# Patient Record
Sex: Male | Born: 1970
Health system: Southern US, Community
[De-identification: ages and names within clinical notes are randomized; demographics above are authoritative.]

## PROBLEM LIST (undated history)

## (undated) DIAGNOSIS — G4730 Sleep apnea, unspecified: Secondary | ICD-10-CM

## (undated) DIAGNOSIS — R51 Headache: Secondary | ICD-10-CM

## (undated) DIAGNOSIS — E785 Hyperlipidemia, unspecified: Secondary | ICD-10-CM

## (undated) DIAGNOSIS — L0390 Cellulitis, unspecified: Secondary | ICD-10-CM

## (undated) DIAGNOSIS — F419 Anxiety disorder, unspecified: Secondary | ICD-10-CM

## (undated) HISTORY — DX: Anxiety disorder, unspecified: F41.9

## (undated) HISTORY — PX: OTHER SURGICAL HISTORY: SHX169

---

## 1998-05-04 ENCOUNTER — Encounter: Admission: RE | Admit: 1998-05-04 | Discharge: 1998-05-04 | Payer: Self-pay | Admitting: Family Medicine

## 1998-05-14 ENCOUNTER — Encounter: Admission: RE | Admit: 1998-05-14 | Discharge: 1998-05-14 | Payer: Self-pay | Admitting: Family Medicine

## 1999-09-23 ENCOUNTER — Encounter: Admission: RE | Admit: 1999-09-23 | Discharge: 1999-09-23 | Payer: Self-pay | Admitting: Family Medicine

## 2001-07-05 ENCOUNTER — Encounter: Admission: RE | Admit: 2001-07-05 | Discharge: 2001-07-05 | Payer: Self-pay | Admitting: Sports Medicine

## 2002-07-31 ENCOUNTER — Emergency Department (HOSPITAL_COMMUNITY): Admission: EM | Admit: 2002-07-31 | Discharge: 2002-07-31 | Payer: Self-pay | Admitting: Emergency Medicine

## 2002-08-05 ENCOUNTER — Encounter: Admission: RE | Admit: 2002-08-05 | Discharge: 2002-08-05 | Payer: Self-pay | Admitting: Family Medicine

## 2003-10-06 ENCOUNTER — Encounter: Admission: RE | Admit: 2003-10-06 | Discharge: 2003-10-06 | Payer: Self-pay | Admitting: Family Medicine

## 2005-05-19 ENCOUNTER — Ambulatory Visit: Payer: Self-pay | Admitting: Family Medicine

## 2006-10-12 ENCOUNTER — Ambulatory Visit: Payer: Self-pay | Admitting: Family Medicine

## 2006-10-12 LAB — CONVERTED CEMR LAB
ALT: 22 units/L (ref 0–53)
AST: 20 units/L (ref 0–37)
Albumin: 4.5 g/dL (ref 3.5–5.2)
Alkaline Phosphatase: 79 units/L (ref 39–117)
Calcium: 9.3 mg/dL (ref 8.4–10.5)
Chloride: 107 meq/L (ref 96–112)
HDL: 38 mg/dL — ABNORMAL LOW (ref >39)
LDL Cholesterol: 160 mg/dL — ABNORMAL HIGH (ref 0–99)
Potassium: 4.4 meq/L (ref 3.5–5.3)
Sodium: 140 meq/L (ref 135–145)
Total Protein: 7.8 g/dL (ref 6.0–8.3)

## 2006-10-22 DIAGNOSIS — F329 Major depressive disorder, single episode, unspecified: Secondary | ICD-10-CM | POA: Insufficient documentation

## 2006-10-22 DIAGNOSIS — E7800 Pure hypercholesterolemia, unspecified: Secondary | ICD-10-CM | POA: Insufficient documentation

## 2006-11-20 ENCOUNTER — Telehealth: Payer: Self-pay | Admitting: *Deleted

## 2007-01-15 ENCOUNTER — Ambulatory Visit: Payer: Self-pay | Admitting: Family Medicine

## 2007-01-19 ENCOUNTER — Telehealth: Payer: Self-pay | Admitting: Family Medicine

## 2007-02-12 ENCOUNTER — Ambulatory Visit: Payer: Self-pay | Admitting: Family Medicine

## 2007-03-05 ENCOUNTER — Telehealth: Payer: Self-pay | Admitting: Family Medicine

## 2007-03-11 ENCOUNTER — Telehealth: Payer: Self-pay | Admitting: Family Medicine

## 2007-03-15 ENCOUNTER — Telehealth: Payer: Self-pay | Admitting: *Deleted

## 2007-03-19 ENCOUNTER — Ambulatory Visit: Payer: Self-pay | Admitting: Family Medicine

## 2007-03-22 ENCOUNTER — Telehealth: Payer: Self-pay | Admitting: Family Medicine

## 2007-03-30 ENCOUNTER — Ambulatory Visit: Payer: Self-pay | Admitting: Family Medicine

## 2007-03-30 ENCOUNTER — Telehealth (INDEPENDENT_AMBULATORY_CARE_PROVIDER_SITE_OTHER): Payer: Self-pay | Admitting: *Deleted

## 2007-03-31 ENCOUNTER — Telehealth (INDEPENDENT_AMBULATORY_CARE_PROVIDER_SITE_OTHER): Payer: Self-pay | Admitting: *Deleted

## 2007-04-06 ENCOUNTER — Telehealth: Payer: Self-pay | Admitting: Family Medicine

## 2007-04-19 ENCOUNTER — Ambulatory Visit: Payer: Self-pay | Admitting: Family Medicine

## 2007-09-10 ENCOUNTER — Telehealth: Payer: Self-pay | Admitting: Family Medicine

## 2007-12-31 ENCOUNTER — Telehealth: Payer: Self-pay | Admitting: *Deleted

## 2008-05-23 ENCOUNTER — Encounter: Payer: Self-pay | Admitting: Family Medicine

## 2008-07-06 ENCOUNTER — Encounter: Payer: Self-pay | Admitting: Family Medicine

## 2008-07-26 ENCOUNTER — Telehealth: Payer: Self-pay | Admitting: Family Medicine

## 2008-08-04 ENCOUNTER — Ambulatory Visit: Payer: Self-pay | Admitting: Family Medicine

## 2008-08-04 DIAGNOSIS — L301 Dyshidrosis [pompholyx]: Secondary | ICD-10-CM | POA: Insufficient documentation

## 2008-08-04 LAB — CONVERTED CEMR LAB: Chlamydia, Swab/Urine, PCR: NEGATIVE

## 2008-08-10 ENCOUNTER — Encounter: Payer: Self-pay | Admitting: Family Medicine

## 2008-08-11 ENCOUNTER — Ambulatory Visit: Payer: Self-pay | Admitting: Family Medicine

## 2008-08-11 ENCOUNTER — Encounter: Payer: Self-pay | Admitting: Family Medicine

## 2008-08-14 ENCOUNTER — Encounter (INDEPENDENT_AMBULATORY_CARE_PROVIDER_SITE_OTHER): Payer: Self-pay | Admitting: *Deleted

## 2008-08-14 LAB — CONVERTED CEMR LAB
AST: 26 units/L (ref 0–37)
Albumin: 4.3 g/dL (ref 3.5–5.2)
Alkaline Phosphatase: 74 units/L (ref 39–117)
Glucose, Bld: 96 mg/dL (ref 70–99)
HDL: 33 mg/dL — ABNORMAL LOW (ref >39)
LDL Cholesterol: 153 mg/dL — ABNORMAL HIGH (ref 0–99)
Potassium: 4.1 meq/L (ref 3.5–5.3)
Sodium: 142 meq/L (ref 135–145)
Total Bilirubin: 0.7 mg/dL (ref 0.3–1.2)
Total Protein: 7.7 g/dL (ref 6.0–8.3)
Triglycerides: 130 mg/dL (ref <150)
VLDL: 26 mg/dL (ref 0–40)

## 2009-03-12 ENCOUNTER — Telehealth: Payer: Self-pay | Admitting: Family Medicine

## 2009-07-30 ENCOUNTER — Telehealth: Payer: Self-pay | Admitting: Family Medicine

## 2009-07-31 ENCOUNTER — Ambulatory Visit: Admission: RE | Admit: 2009-07-31 | Discharge: 2009-07-31 | Payer: Self-pay | Admitting: Family Medicine

## 2009-07-31 ENCOUNTER — Encounter: Payer: Self-pay | Admitting: Family Medicine

## 2009-07-31 ENCOUNTER — Ambulatory Visit: Payer: Self-pay | Admitting: Family Medicine

## 2009-07-31 ENCOUNTER — Ambulatory Visit: Payer: Self-pay | Admitting: Vascular Surgery

## 2009-07-31 DIAGNOSIS — R51 Headache: Secondary | ICD-10-CM | POA: Insufficient documentation

## 2009-07-31 DIAGNOSIS — R519 Headache, unspecified: Secondary | ICD-10-CM | POA: Insufficient documentation

## 2009-08-03 ENCOUNTER — Encounter: Payer: Self-pay | Admitting: Family Medicine

## 2009-08-03 DIAGNOSIS — L02419 Cutaneous abscess of limb, unspecified: Secondary | ICD-10-CM | POA: Insufficient documentation

## 2009-08-03 DIAGNOSIS — L03119 Cellulitis of unspecified part of limb: Secondary | ICD-10-CM

## 2009-08-06 ENCOUNTER — Encounter: Payer: Self-pay | Admitting: Family Medicine

## 2009-08-08 ENCOUNTER — Telehealth (INDEPENDENT_AMBULATORY_CARE_PROVIDER_SITE_OTHER): Payer: Self-pay | Admitting: Family Medicine

## 2009-08-08 ENCOUNTER — Encounter: Payer: Self-pay | Admitting: Family Medicine

## 2010-09-26 ENCOUNTER — Encounter: Payer: Self-pay | Admitting: *Deleted

## 2011-08-01 ENCOUNTER — Institutional Professional Consult (permissible substitution): Payer: Self-pay | Admitting: Internal Medicine

## 2012-10-08 ENCOUNTER — Encounter: Payer: Self-pay | Admitting: Family Medicine

## 2012-10-22 ENCOUNTER — Ambulatory Visit (INDEPENDENT_AMBULATORY_CARE_PROVIDER_SITE_OTHER): Payer: BC Managed Care – PPO | Admitting: Family Medicine

## 2012-10-22 ENCOUNTER — Encounter: Payer: Self-pay | Admitting: Family Medicine

## 2012-10-22 VITALS — BP 140/88 | HR 84 | Temp 98.1°F | Ht 72.0 in | Wt 309.0 lb

## 2012-10-22 DIAGNOSIS — E7800 Pure hypercholesterolemia, unspecified: Secondary | ICD-10-CM

## 2012-10-22 DIAGNOSIS — E669 Obesity, unspecified: Secondary | ICD-10-CM

## 2012-10-22 DIAGNOSIS — L309 Dermatitis, unspecified: Secondary | ICD-10-CM

## 2012-10-22 DIAGNOSIS — L259 Unspecified contact dermatitis, unspecified cause: Secondary | ICD-10-CM

## 2012-10-22 DIAGNOSIS — G4730 Sleep apnea, unspecified: Secondary | ICD-10-CM

## 2012-10-22 DIAGNOSIS — R030 Elevated blood-pressure reading, without diagnosis of hypertension: Secondary | ICD-10-CM

## 2012-10-22 DIAGNOSIS — G4733 Obstructive sleep apnea (adult) (pediatric): Secondary | ICD-10-CM | POA: Insufficient documentation

## 2012-10-22 DIAGNOSIS — G4710 Hypersomnia, unspecified: Secondary | ICD-10-CM

## 2012-10-22 LAB — LIPID PANEL
Cholesterol: 226 mg/dL — ABNORMAL HIGH (ref 0–200)
LDL Cholesterol: 157 mg/dL — ABNORMAL HIGH (ref 0–99)
VLDL: 29 mg/dL (ref 0–40)

## 2012-10-22 LAB — COMPLETE METABOLIC PANEL WITH GFR
ALT: 24 U/L (ref 0–53)
AST: 22 U/L (ref 0–37)
Creat: 0.99 mg/dL (ref 0.50–1.35)
Total Bilirubin: 0.5 mg/dL (ref 0.3–1.2)

## 2012-10-22 MED ORDER — TRIAMCINOLONE ACETONIDE 0.1 % EX OINT: TOPICAL_OINTMENT | Freq: Every day | CUTANEOUS | Status: DC

## 2012-10-22 NOTE — Assessment & Plan Note (Signed)
Refill med 

## 2012-10-22 NOTE — Assessment & Plan Note (Signed)
Will recheck to see if meds needed.  For now diet and exercise.

## 2012-10-22 NOTE — Assessment & Plan Note (Signed)
Sleep study

## 2012-10-22 NOTE — Assessment & Plan Note (Signed)
The root of many of his problems.  Consider bariatric surgery but focus on diet and other health concerns

## 2012-10-22 NOTE — Assessment & Plan Note (Signed)
Check labs 

## 2012-10-22 NOTE — Patient Instructions (Addendum)
You have a lot going on right now.   My nurse will set you up for a sleep study.  I really think you have a sleep apnea. I am worried about your weight, your blood pressure, and your risk of diabetes.  All of these things would improve with weight loss. See me in 2-3 weeks.  I want to recheck your blood pressure and go over your blood work results. For your blood pressure, make sure you cut back on your salt/sodium intake.

## 2012-10-22 NOTE — Progress Notes (Signed)
  Subjective:    Patient ID: Joel West, male    DOB: 01/16/1971, 42 y.o.   MRN: [id number]  HPI Reestablishing care after no insurance for a while.   Multiple issues.   Compelling story for sleep apnea with snoring, daytime sleepiness, nighttime awakening and an ex who said he would stop breathing for a minute at a time Borderline hypertension Obesity Hx of high cholesterol Fhx + for DM Hx of eczema   Review of Systems     Objective:   Physical Exam BP and BMI noted Lungs clear, Cardiac RRR without m or g Abd benign Skin, eczematous patches.       Assessment & Plan:

## 2012-11-11 ENCOUNTER — Ambulatory Visit (HOSPITAL_BASED_OUTPATIENT_CLINIC_OR_DEPARTMENT_OTHER): Payer: BC Managed Care – PPO | Attending: Family Medicine | Admitting: Radiology

## 2012-11-11 VITALS — Ht 71.5 in | Wt 309.0 lb

## 2012-11-11 DIAGNOSIS — G4710 Hypersomnia, unspecified: Secondary | ICD-10-CM

## 2012-11-11 DIAGNOSIS — G4733 Obstructive sleep apnea (adult) (pediatric): Secondary | ICD-10-CM | POA: Insufficient documentation

## 2012-11-14 DIAGNOSIS — G4733 Obstructive sleep apnea (adult) (pediatric): Secondary | ICD-10-CM

## 2012-11-14 NOTE — Procedures (Signed)
NAME:  Joel West, LINE NO.:  [id number]  MEDICAL RECORD NO.:  [id number]          PATIENT TYPE:  OUT  LOCATION:  SLEEP CENTER                 FACILITY:  [facility]  PHYSICIAN:  Clinton D. Maple Hudson, MD, FCCP, FACPDATE OF BIRTH:  08-22-1970  DATE OF STUDY:  11/11/2012                           NOCTURNAL POLYSOMNOGRAM  REFERRING PHYSICIAN:  Chrissie Noa A. Leveda Anna, M.D.  INDICATION FOR STUDY:  Hypersomnia with sleep apnea.  EPWORTH SLEEPINESS SCORE:  21/24.  BMI 42.5, weight 309 pounds, height 71.5 inches, neck 19 inches.  MEDICATIONS:  Home medications are charted as "no medications."  SLEEP ARCHITECTURE:  Split study protocol.  During the diagnostic phase, total sleep time 121 minutes with sleep efficiency 61.6%.  Stage I was 41.7%, stage II was 40.5%, stage III was absent, REM 17.8% of total sleep time.  Sleep latency 22.5 minutes, REM latency 143.5 minutes, awake after sleep onset 45.5 minutes.  Arousal index 73.4.  BEDTIME MEDICATION:  None.  Sleep pattern was marked by fragmentation and frequent brief wakings throughout the night.  RESPIRATORY DATA:  Split study protocol.  Apnea-hypopnea index (AHI) 93.7 per hour.  A total of 189 events was scored including 137 obstructive apneas, 7 central apneas, 2 mixed apneas, 43 hypopneas. Events were not positional.  REM/AHI 106 per hour.  CPAP was titrated to 15 CWP with inadequate control and residual AHI of 73.3 per hour.  He was then changed to bilevel mode at a final pressure of inspiratory 23 and expiratory 19 CWP, which gave AHI 10 per hour.  He wore a large ResMed Quattro FX full-face mask with heated humidifier.  He was able to sustain sleep at final BiPAP pressures.  OXYGEN DATA:  Extremely loud snoring before CPAP with oxygen desaturation to a nadir of 76% on room air.  With BiPAP control, mean oxygen held 96.1% on room air and snoring was prevented.  CARDIAC DATA:  Sinus rhythm with occasional  PVC.  MOVEMENT-PARASOMNIA:  No significant movement disturbance.  Bathroom x1.  IMPRESSIONS-RECOMMENDATIONS: 1. Severe obstructive sleep apnea/hypopnea syndrome, apnea-hypopnea     index 93.7 per hour with non-positional events.  Rapid eye movement     apnea-hypopnea index 106 per hour. 2. Successful pressure titration.  Continuous positive airway pressure     at 15 centimeters of water pressure was inadequate leaving residual     apnea-hypopnea index 73.3 per hour.  With BiPAP, with the final     pressure of     inspiratory 23 and expiratory 19, he got good control with apnea-     hypopnea index 10 per hour.  Snoring was prevented and mean oxygen     saturation of 96.1% on room air with BiPAP.     Clinton D. Maple Hudson, MD, [facility], FACP Diplomate, American Board of Sleep Medicine    CDY/MEDQ  D:  11/14/2012 12:44:03  T:  11/14/2012 16:47:01  Job:  [id number]

## 2012-11-15 ENCOUNTER — Encounter: Payer: Self-pay | Admitting: Family Medicine

## 2012-12-08 ENCOUNTER — Other Ambulatory Visit: Payer: Self-pay | Admitting: Family Medicine

## 2012-12-08 DIAGNOSIS — G4733 Obstructive sleep apnea (adult) (pediatric): Secondary | ICD-10-CM

## 2012-12-09 ENCOUNTER — Telehealth: Payer: Self-pay | Admitting: Family Medicine

## 2012-12-09 ENCOUNTER — Other Ambulatory Visit: Payer: Self-pay | Admitting: Family Medicine

## 2012-12-09 DIAGNOSIS — G4733 Obstructive sleep apnea (adult) (pediatric): Secondary | ICD-10-CM

## 2012-12-09 NOTE — Telephone Encounter (Signed)
Left message on voicemail. Joel West S  

## 2012-12-09 NOTE — Telephone Encounter (Signed)
Pt states you were going to prescribe HCTZ for weight lost,He states you discuss this at his last visit.Please advise . Junetta Hearn, Virgel Bouquet

## 2012-12-09 NOTE — Telephone Encounter (Signed)
Please clarify with patient. There is no effective weight loss pill - I do not prescribe any, period. The HCTZ would be prescribed if he had consistently elevated blood pressure.  He needs to follow up with me and bring in outside blood pressure readings.

## 2012-12-09 NOTE — Telephone Encounter (Signed)
Patient is calling to see if he needs a refill for his weight loss pill.

## 2012-12-09 NOTE — Telephone Encounter (Signed)
Patient is calling back to tell the nurse that he wasn't talking about HCTZ, he was talking about HCD.

## 2012-12-09 NOTE — Telephone Encounter (Signed)
Patient returned my call he states BP is not a problem.I again related message and instructed him to schedule appt to address this with Dr Bari Mantis, Vir[place] Bouquet

## 2012-12-31 ENCOUNTER — Telehealth: Payer: Self-pay | Admitting: Family Medicine

## 2012-12-31 NOTE — Telephone Encounter (Signed)
After talking with patient he stated he's needing to cancel appt because he would be out of town,I instructed patient to call pulmonary office and cancel he voiced understanding. Kellyanne Ellwanger, Virgel Bouquet

## 2012-12-31 NOTE — Telephone Encounter (Signed)
Pt has appt next Fri w/ [facility] and is wanting to know why - his CPAP is working well and is not sure why he needs to - wants [place] to call and talk with him about this before it's cancelled.

## 2013-01-07 ENCOUNTER — Institutional Professional Consult (permissible substitution): Payer: BC Managed Care – PPO | Admitting: Pulmonary Disease

## 2013-03-11 ENCOUNTER — Ambulatory Visit (INDEPENDENT_AMBULATORY_CARE_PROVIDER_SITE_OTHER): Payer: BC Managed Care – PPO | Admitting: Family Medicine

## 2013-03-11 ENCOUNTER — Encounter: Payer: Self-pay | Admitting: Family Medicine

## 2013-03-11 ENCOUNTER — Other Ambulatory Visit (HOSPITAL_COMMUNITY)
Admission: RE | Admit: 2013-03-11 | Discharge: 2013-03-11 | Disposition: A | Discharge status: Home / Self Care | Payer: BC Managed Care – PPO | Source: Ambulatory Visit | Attending: Family Medicine | Admitting: Family Medicine

## 2013-03-11 ENCOUNTER — Other Ambulatory Visit: Payer: Self-pay | Admitting: Family Medicine

## 2013-03-11 VITALS — BP 133/81 | HR 74 | Ht 71.0 in | Wt 306.0 lb

## 2013-03-11 DIAGNOSIS — Z7251 High risk heterosexual behavior: Secondary | ICD-10-CM

## 2013-03-11 DIAGNOSIS — L309 Dermatitis, unspecified: Secondary | ICD-10-CM

## 2013-03-11 DIAGNOSIS — J309 Allergic rhinitis, unspecified: Secondary | ICD-10-CM | POA: Insufficient documentation

## 2013-03-11 DIAGNOSIS — Z113 Encounter for screening for infections with a predominantly sexual mode of transmission: Secondary | ICD-10-CM | POA: Insufficient documentation

## 2013-03-11 DIAGNOSIS — Z202 Contact with and (suspected) exposure to infections with a predominantly sexual mode of transmission: Secondary | ICD-10-CM | POA: Insufficient documentation

## 2013-03-11 DIAGNOSIS — N62 Hypertrophy of breast: Secondary | ICD-10-CM

## 2013-03-11 DIAGNOSIS — Z2089 Contact with and (suspected) exposure to other communicable diseases: Secondary | ICD-10-CM

## 2013-03-11 DIAGNOSIS — L259 Unspecified contact dermatitis, unspecified cause: Secondary | ICD-10-CM

## 2013-03-11 LAB — HIV ANTIBODY (ROUTINE TESTING W REFLEX): HIV: NONREACTIVE

## 2013-03-11 MED ORDER — CETIRIZINE HCL 10 MG PO TABS: 10.0000 mg | ORAL_TABLET | Freq: Every day | ORAL | Status: DC

## 2013-03-11 MED ORDER — TRIAMCINOLONE ACETONIDE 0.1 % EX OINT: TOPICAL_OINTMENT | Freq: Every day | CUTANEOUS | Status: DC

## 2013-03-11 NOTE — Assessment & Plan Note (Signed)
Test post exposure.

## 2013-03-11 NOTE — Patient Instructions (Addendum)
I will send a letter with all the blood tests.   Sign up for My Chart and you will be able to see the results yourself. I sent in prescriptions for allergy pills and skin cream I believe you have gynecomastia.  I will do the ultrasound to make sure. Google gynecomastia to learn more.  I will call with the ultrasound.

## 2013-03-11 NOTE — Progress Notes (Signed)
  Subjective:    Patient ID: Joel West, male    DOB: 03-27-1971, 42 y.o.   MRN: [id number]  HPI C/O exposure to STD - condom broke. C/O 1 year swelling of left breast - started with trauma. Working to bring his weight down.      Review of Systems     Objective:   Physical ExamModest weight loss noted Gynecomastia on left.  May have small amount on right or may just be obesity.        Assessment & Plan:

## 2013-03-11 NOTE — Assessment & Plan Note (Signed)
He is worried about fluid.  I am worried about gynecomastia.  Will check ultrasound.  Not on any drugs or marijuana or soy products.  Would be idiopathic or related to obesity.

## 2013-03-15 ENCOUNTER — Encounter: Payer: Self-pay | Admitting: Family Medicine

## 2013-03-24 ENCOUNTER — Inpatient Hospital Stay: Admission: RE | Admit: 2013-03-24 | Payer: BC Managed Care – PPO | Source: Ambulatory Visit

## 2013-03-24 ENCOUNTER — Other Ambulatory Visit: Payer: BC Managed Care – PPO

## 2013-04-14 ENCOUNTER — Other Ambulatory Visit: Payer: BC Managed Care – PPO

## 2013-05-19 ENCOUNTER — Ambulatory Visit
Admission: RE | Admit: 2013-05-19 | Discharge: 2013-05-19 | Disposition: A | Discharge status: Home / Self Care | Payer: BC Managed Care – PPO | Source: Ambulatory Visit | Attending: Family Medicine | Admitting: Family Medicine

## 2013-05-19 DIAGNOSIS — N62 Hypertrophy of breast: Secondary | ICD-10-CM

## 2013-07-04 ENCOUNTER — Telehealth: Payer: Self-pay | Admitting: Family Medicine

## 2013-07-04 DIAGNOSIS — E7800 Pure hypercholesterolemia, unspecified: Secondary | ICD-10-CM

## 2013-07-04 DIAGNOSIS — G4733 Obstructive sleep apnea (adult) (pediatric): Secondary | ICD-10-CM

## 2013-07-04 DIAGNOSIS — E669 Obesity, unspecified: Secondary | ICD-10-CM

## 2013-07-04 NOTE — Telephone Encounter (Signed)
Pt called and would like a referral for gastric bypass surgery. JW

## 2013-07-04 NOTE — Telephone Encounter (Signed)
Please advise. Joel West S  

## 2013-07-05 NOTE — Telephone Encounter (Signed)
Initial request was for bariatric surgery referral.  Now wants to "go to the gym" and see a nutritionist.  I encouraged this non surgical approach.

## 2013-07-05 NOTE — Assessment & Plan Note (Signed)
Nutrition referral.

## 2013-12-23 DIAGNOSIS — L0390 Cellulitis, unspecified: Secondary | ICD-10-CM

## 2013-12-23 HISTORY — DX: Cellulitis, unspecified: L03.90

## 2014-01-06 ENCOUNTER — Observation Stay (HOSPITAL_COMMUNITY)
Admission: AD | Admit: 2014-01-06 | Discharge: 2014-01-08 | Disposition: A | Discharge status: Home / Self Care | Payer: BC Managed Care – PPO | Source: Ambulatory Visit | Attending: Family Medicine | Admitting: Family Medicine

## 2014-01-06 ENCOUNTER — Encounter: Payer: Self-pay | Admitting: Family Medicine

## 2014-01-06 ENCOUNTER — Encounter (HOSPITAL_COMMUNITY): Payer: Self-pay | Admitting: General Practice

## 2014-01-06 ENCOUNTER — Telehealth: Payer: Self-pay | Admitting: Family Medicine

## 2014-01-06 ENCOUNTER — Ambulatory Visit (INDEPENDENT_AMBULATORY_CARE_PROVIDER_SITE_OTHER): Payer: BC Managed Care – PPO | Admitting: Family Medicine

## 2014-01-06 VITALS — BP 117/59 | HR 96 | Temp 101.1°F | Ht 71.0 in | Wt 300.0 lb

## 2014-01-06 DIAGNOSIS — N179 Acute kidney failure, unspecified: Secondary | ICD-10-CM | POA: Insufficient documentation

## 2014-01-06 DIAGNOSIS — L03119 Cellulitis of unspecified part of limb: Principal | ICD-10-CM

## 2014-01-06 DIAGNOSIS — L03115 Cellulitis of right lower limb: Secondary | ICD-10-CM

## 2014-01-06 DIAGNOSIS — L0390 Cellulitis, unspecified: Secondary | ICD-10-CM | POA: Diagnosis present

## 2014-01-06 DIAGNOSIS — E785 Hyperlipidemia, unspecified: Secondary | ICD-10-CM | POA: Insufficient documentation

## 2014-01-06 DIAGNOSIS — G4733 Obstructive sleep apnea (adult) (pediatric): Secondary | ICD-10-CM | POA: Insufficient documentation

## 2014-01-06 DIAGNOSIS — L02419 Cutaneous abscess of limb, unspecified: Secondary | ICD-10-CM

## 2014-01-06 DIAGNOSIS — R509 Fever, unspecified: Secondary | ICD-10-CM | POA: Insufficient documentation

## 2014-01-06 DIAGNOSIS — M79609 Pain in unspecified limb: Secondary | ICD-10-CM

## 2014-01-06 DIAGNOSIS — E669 Obesity, unspecified: Secondary | ICD-10-CM | POA: Insufficient documentation

## 2014-01-06 DIAGNOSIS — J309 Allergic rhinitis, unspecified: Secondary | ICD-10-CM | POA: Insufficient documentation

## 2014-01-06 DIAGNOSIS — L0291 Cutaneous abscess, unspecified: Secondary | ICD-10-CM

## 2014-01-06 DIAGNOSIS — Z6841 Body Mass Index (BMI) 40.0 and over, adult: Secondary | ICD-10-CM | POA: Insufficient documentation

## 2014-01-06 HISTORY — DX: Sleep apnea, unspecified: G47.30

## 2014-01-06 HISTORY — DX: Cellulitis, unspecified: L03.90

## 2014-01-06 HISTORY — DX: Hyperlipidemia, unspecified: E78.5

## 2014-01-06 HISTORY — DX: Headache: R51

## 2014-01-06 LAB — COMPREHENSIVE METABOLIC PANEL
ALBUMIN: 3.4 g/dL — AB (ref 3.5–5.2)
ALK PHOS: 69 U/L (ref 39–117)
ALT: 21 U/L (ref 0–53)
AST: 22 U/L (ref 0–37)
BILIRUBIN TOTAL: 0.7 mg/dL (ref 0.3–1.2)
BUN: 11 mg/dL (ref 6–23)
CO2: 25 meq/L (ref 19–32)
Calcium: 9 mg/dL (ref 8.4–10.5)
Chloride: 103 mEq/L (ref 96–112)
Creatinine, Ser: 1.33 mg/dL (ref 0.50–1.35)
GFR, EST AFRICAN AMERICAN: 74 mL/min — AB (ref >90)
GFR, EST NON AFRICAN AMERICAN: 64 mL/min — AB (ref >90)
GLUCOSE: 115 mg/dL — AB (ref 70–99)
POTASSIUM: 3.9 meq/L (ref 3.7–5.3)
Sodium: 141 mEq/L (ref 137–147)
Total Protein: 7.5 g/dL (ref 6.0–8.3)

## 2014-01-06 LAB — CBC WITH DIFFERENTIAL/PLATELET
BASOS ABS: 0 10*3/uL (ref 0.0–0.1)
Basophils Relative: 0 % (ref 0–1)
EOS PCT: 1 % (ref 0–5)
Eosinophils Absolute: 0.1 10*3/uL (ref 0.0–0.7)
HCT: 39.2 % (ref 39.0–52.0)
Hemoglobin: 13.2 g/dL (ref 13.0–17.0)
Lymphocytes Relative: 17 % (ref 12–46)
Lymphs Abs: 1.6 10*3/uL (ref 0.7–4.0)
MCH: 30.8 pg (ref 26.0–34.0)
MCHC: 33.7 g/dL (ref 30.0–36.0)
MCV: 91.4 fL (ref 78.0–100.0)
Monocytes Absolute: 1.1 10*3/uL — ABNORMAL HIGH (ref 0.1–1.0)
Monocytes Relative: 12 % (ref 3–12)
Neutro Abs: 6.6 10*3/uL (ref 1.7–7.7)
Neutrophils Relative %: 70 % (ref 43–77)
PLATELETS: 124 10*3/uL — AB (ref 150–400)
RBC: 4.29 MIL/uL (ref 4.22–5.81)
RDW: 13.4 % (ref 11.5–15.5)
WBC: 9.4 10*3/uL (ref 4.0–10.5)

## 2014-01-06 LAB — SEDIMENTATION RATE: SED RATE: 92 mm/h — AB (ref 0–16)

## 2014-01-06 MED ORDER — VANCOMYCIN HCL 10 G IV SOLR
1250.0000 mg | Freq: Two times a day (BID) | INTRAVENOUS | Status: DC
  Administered 2014-01-07: 1250 mg via INTRAVENOUS
  Filled 2014-01-06 (×2): qty 1250

## 2014-01-06 MED ORDER — ACETAMINOPHEN 325 MG PO TABS
650.0000 mg | ORAL_TABLET | ORAL | Status: DC | PRN
  Administered 2014-01-06 – 2014-01-08 (×4): 650 mg via ORAL
  Filled 2014-01-06 (×4): qty 2

## 2014-01-06 MED ORDER — ONDANSETRON 8 MG/NS 50 ML IVPB
8.0000 mg | Freq: Three times a day (TID) | INTRAVENOUS | Status: DC | PRN
  Filled 2014-01-06: qty 8

## 2014-01-06 MED ORDER — ONDANSETRON 8 MG PO TBDP
8.0000 mg | ORAL_TABLET | Freq: Three times a day (TID) | ORAL | Status: DC | PRN
  Filled 2014-01-06: qty 1

## 2014-01-06 MED ORDER — HEPARIN SODIUM (PORCINE) 5000 UNIT/ML IJ SOLN
5000.0000 [IU] | Freq: Three times a day (TID) | INTRAMUSCULAR | Status: DC
  Administered 2014-01-06 – 2014-01-08 (×6): 5000 [IU] via SUBCUTANEOUS
  Filled 2014-01-06 (×9): qty 1

## 2014-01-06 MED ORDER — VANCOMYCIN HCL 10 G IV SOLR
2000.0000 mg | Freq: Once | INTRAVENOUS | Status: AC
  Administered 2014-01-06: 2000 mg via INTRAVENOUS
  Filled 2014-01-06: qty 2000

## 2014-01-06 MED ORDER — SULFAMETHOXAZOLE-TMP DS 800-160 MG PO TABS: 1.0000 | ORAL_TABLET | Freq: Two times a day (BID) | ORAL | Status: DC

## 2014-01-06 MED ORDER — MORPHINE SULFATE 2 MG/ML IJ SOLN: 2.0000 mg | INTRAMUSCULAR | Status: DC | PRN

## 2014-01-06 NOTE — H&P (Signed)
[facility] Admission History and Physical Service Pager: [phone]  Patient name: Joel West Medical record number: [id number] Date of birth: October 24, 1970 Age: 43 y.o. Gender: male  Primary Care Provider: Zigmund Gottron, MD Consultants: None Code Status: Full code  Chief Complaint: Leg pain  Assessment and Plan: MENA LIENAU is a 43 y.o. male presenting with cellulitis of right leg. PMH is significant for right leg cellulitis  # Cellulitis, right leg: recurrent episode. Does not cross any joint lines. Patient will most likely benefit from IV antibiotics before transition to oral. Since it is the weekend, will also allow for proper follow-up of response to treatment. Patient overall not toxic. Will obtain labs to assess if he meets sirs criteria, however, looks stable.  Place in observation, med-surg, attending physician Dr. Mingo Amber  Vancomycin per pharmacy  Follow-up blood cultures  Morphine PRN for pain management  Follow-up CBC, CMET, sed rate  Will monitor for improvement on Vancomycin and consider transition to oral antibiotics tomorrow with close follow-up in clinic on Monday  # Calf tenderness: most likely related to cellulitis, but cannot exclude DVT. No symptoms suggesting PE.  Follow-up venous dopplers  FEN/GI: Regular diet Prophylaxis: Heparin subq  Disposition: Place in observation  History of Present Illness: Joel West is a 43 y.o. male presenting with right leg pain. Patient noticed pain and swelling in right lower extremity two days ago. Since then pain has gotten worse and decided to come to clinic to be evaluated. He has had a similar episode in 2010 where he was diagnosed with cellulitis and abscess of his lower extremity. Patient reports no history of trauma to the area. He has been walking more in the past two days (about 30 miles in two days). He reports no chest pain, shortness of breath, or hemoptysis. No fever at  home. Has had some chills.  In the clinic, patient found to be febrile to 101.65F, normotensive and with normal heart rate  Review Of Systems: Per HPI with the following additions: No fever t Otherwise 12 point review of systems was performed and was unremarkable.  Patient Active Problem List   Diagnosis Date Noted  . Exposure to STD 03/11/2013  . Gynecomastia, male 03/11/2013  . Allergic rhinitis 03/11/2013  . Eczema 10/22/2012  . Obstructive sleep apnea 10/22/2012  . Elevated blood pressure (not hypertension) 10/22/2012  . HYPERCHOLESTEROLEMIA 10/22/2006  . OBESITY, NOS 10/22/2006   Past Medical History: No past medical history on file. Past Surgical History: No past surgical history on file. Social History: History  Substance Use Topics  . Smoking status: Never Smoker   . Smokeless tobacco: Not on file  . Alcohol Use: 3.0 - 4.0 oz/week    6-8 drink(s) per week   Additional social history: None  Please also refer to relevant sections of EMR.  Family History: No family history on file. Allergies and Medications: No Known Allergies No current facility-administered medications on file prior to encounter.   Current Outpatient Prescriptions on File Prior to Encounter  Medication Sig Dispense Refill  . cetirizine (ZYRTEC) 10 MG tablet Take 1 tablet (10 mg total) by mouth daily.  30 tablet  11  . triamcinolone ointment (KENALOG) 0.1 % Apply topically at bedtime.  30 g  3    Objective: There were no vitals taken for this visit. Exam: General: Obese, comfortable, in no acute distress HEENT: PERRL, EOMI, moist mucous membranes Cardiovascular: Regular rate and rhythm, no murmur Respiratory: Clear to auscultation  bilaterally, no wheezing, no increased work of breathing Abdomen: Soft, obese, non-tender Extremities: Right leg is erythematous, warm and tender anteriorly and posteriorly. No noticeable fluctuance, no purulent drainage. Tenderness extends from medial/lateral  malleolus to just below knee. Calf is tender, and not swollen compared to left. Holman's sign negative. Full range of motion of ankle and knee. Skin:  Neuro: Alert, oriented, grossly normal.  Labs and Imaging: CBC BMET  No results found for this basename: WBC, HGB, HCT, PLT,  in the last 168 hours No results found for this basename: NA, K, CL, CO2, BUN, CREATININE, GLUCOSE, CALCIUM,  in the last 168 hours    Cordelia Poche, MD 01/06/2014, 11:08 AM PGY-1, [place] Intern pager: [phone], text pages welcome

## 2014-01-06 NOTE — Progress Notes (Addendum)
FPTS INTERIM PROGRESS NOTE  Briefly: admitted from [facility] clinic this morning by Dr. Lonny Prude for right LE cellulitis. Pt has had history of cellulitis about 5 years ago that was treated as outpatient.   S: No complaints currently. Leg is painful when touched, gets pins and needles feeling when bearing weight. Denies n/v, fevers (had 101F in clinic), did have some sweating last night, no CP, SOB.  O: BP 117/72  Pulse 75  Temp(Src) 99.2 F (37.3 C) (Oral)  Resp 20  Ht _0  (1.803 m)  Wt 303 lb 6.4 oz (137.621 kg)  BMI 42.33 kg/m2  SpO2 98%  General: NAD, sitting upright in bed eating dinner HEENT: PERRL, EOMI. CV: RRR, normal s1/s2, no murmurs appreciated Resp: CTAB normal effort Abdomen: obese, soft, NTND, normal bowel sounds Ext: right lower extremity with erythema and swelling on lateral aspect from 6cm below knee to ankle. Area is warm, slightly tender. No appreciable fluctuance or mass. Able to move all toes. Neuro: alert and oriented, no focal deficits, no loss of sensation to light touch of right leg  A/P: Joel West is a 43 y.o. male admitted from clinic for right leg cellulitis. Labs within normal limits including Cmet, CBC (WBC 9.4), ESR elevated to 92. On vancomycin, morphine 70m q4hr prn and tylenol for pain control. No plan on imaging at this time.  ATawanna Sat MD 01/06/2014, 6:51 PM PGY-1, [facility] Pager: [phone] text pages welcome

## 2014-01-06 NOTE — Progress Notes (Signed)
ANTIBIOTIC CONSULT NOTE - INITIAL  Pharmacy Consult for vancomycin Indication: cellulitis  No Known Allergies  Patient Measurements: Height: 5\' 11"  (180.3 cm) Weight: 303 lb 6.4 oz (137.621 kg) IBW/kg (Calculated) : 75.3  Vital Signs: Temp: 99.2 F (37.3 C) (05/15 1143) Temp src: Oral (05/15 1143) BP: 122/78 mmHg (05/15 1143) Pulse Rate: 80 (05/15 1143) Intake/Output from previous day:   Intake/Output from this shift:    Labs: No results found for this basename: WBC, HGB, PLT, LABCREA, CREATININE,  in the last 72 hours Estimated Creatinine Clearance: 134.9 ml/min (by C-G formula based on Cr of 0.99). No results found for this basename: VANCOTROUGH, VANCOPEAK, VANCORANDOM, GENTTROUGH, GENTPEAK, GENTRANDOM, TOBRATROUGH, TOBRAPEAK, TOBRARND, AMIKACINPEAK, AMIKACINTROU, AMIKACIN,  in the last 72 hours   Microbiology: No results found for this or any previous visit (from the past 720 hour(s)).  Medical History: No past medical history on file.  Medications:  Anti-infectives   Start     Dose/Rate Route Frequency Ordered Stop   01/06/14 1330  vancomycin (VANCOCIN) 2,000 mg in sodium chloride 0.9 % 500 mL IVPB     2,000 mg 250 mL/hr over 120 Minutes Intravenous  Once 01/06/14 1221       Assessment: 12 yom presented to the hospital with leg pain to start IV vancomycin for treatment of cellulitis. Tmax is 101. No labs available yet but renal fxn has been normal in the past.   Vanc 5/15>>  Goal of Therapy:  Vancomycin trough level 10-15 mcg/ml  Plan:  1. Vancomycin 2gm IV x 1 then 1250mg  IV Q12H (will adjust based on Scr if needed) 2. F/u renal fxn, C&S, clinical status and trough at [place] 01/06/2014,12:21 PM

## 2014-01-06 NOTE — Telephone Encounter (Signed)
Patient calling reporting swollen leg and severe pain in leg preventing ambulation. He wants to be seen in clinic today. Will route this to the clinic staff and have them arrange and appt for him.

## 2014-01-06 NOTE — H&P (Signed)
Attending Addendum  I examined the patient and discussed the assessment and plan with Dr. Nettey. I have reviewed the note and agree.    Geralda Baumgardner, MD FAMILY MEDICINE TEACHING SERVICE    

## 2014-01-06 NOTE — Progress Notes (Addendum)
*  PRELIMINARY RESULTS* Vascular Ultrasound Right lower extremity venous duplex has been completed.  Preliminary findings: Right:  No evidence of DVT, superficial thrombosis, or Baker's cyst. Enlarged and vascularized lymph nodes noted in groin area.   [place] 01/06/2014, 7:05 PM

## 2014-01-07 LAB — BASIC METABOLIC PANEL
BUN: 11 mg/dL (ref 6–23)
CO2: 23 meq/L (ref 19–32)
Calcium: 8.8 mg/dL (ref 8.4–10.5)
Chloride: 103 mEq/L (ref 96–112)
Creatinine, Ser: 1.13 mg/dL (ref 0.50–1.35)
GFR calc Af Amer: 90 mL/min — ABNORMAL LOW (ref >90)
GFR, EST NON AFRICAN AMERICAN: 77 mL/min — AB (ref >90)
GLUCOSE: 109 mg/dL — AB (ref 70–99)
POTASSIUM: 3.5 meq/L — AB (ref 3.7–5.3)
Sodium: 138 mEq/L (ref 137–147)

## 2014-01-07 MED ORDER — POTASSIUM CHLORIDE CRYS ER 20 MEQ PO TBCR
20.0000 meq | EXTENDED_RELEASE_TABLET | Freq: Once | ORAL | Status: AC
  Administered 2014-01-07: 20 meq via ORAL
  Filled 2014-01-07: qty 1

## 2014-01-07 MED ORDER — SULFAMETHOXAZOLE-TMP DS 800-160 MG PO TABS
1.0000 | ORAL_TABLET | Freq: Two times a day (BID) | ORAL | Status: DC
  Administered 2014-01-07 – 2014-01-08 (×3): 1 via ORAL
  Filled 2014-01-07 (×4): qty 1

## 2014-01-07 NOTE — Progress Notes (Signed)
Family Medicine Teaching Service Daily Progress Note Intern Pager: [phone]  Patient name: Joel West Medical record number: [id number] Date of birth: 25-Nov-1970 Age: 43 y.o. Gender: male  Primary Care Provider: Zigmund Gottron, MD Consultants: none Code Status: full code  Pt Overview and Major Events to Date:  5/16: admitted to hospital with LE cellulitis for tx with IV abx  Assessment and Plan:  Joel West is a 43 y.o. male presenting with cellulitis of right leg. PMH is significant for right leg cellulitis   # Cellulitis, right leg: recurrent episode. Does not cross any joint lines. ESR 92. Has been on vancomycin per pharmacy  Follow-up blood cultures  D/c order for morphine prn pain, as he has not required it, just receiving tylenol prn Transition to PO abx today (doxycycline), monitor for another 24 hours on PO abx  # Calf tenderness: most likely related to cellulitis. No symptoms suggesting PE.  Venous dopplers negative for DVT  # Mild AKI: baseline  Cr around 1, noted to be 1.33 on admit - repeat BMET today  FEN/GI: Regular diet  Prophylaxis: Heparin subq  Disposition: likely d/c home tomorrow if continues to improve on PO abx  Subjective:  Pt states he is feeling well. Thinks swelling of his leg has gotten better. Eating and drinking well. No complaints.  Objective: Temp:  [98.7 F (37.1 C)-99.7 F (37.6 C)] 98.7 F (37.1 C) (05/16 0449) Pulse Rate:  [72-86] 72 (05/16 0449) Resp:  [18-20] 18 (05/16 0449) BP: (117-142)/(72-91) 132/81 mmHg (05/16 0449) SpO2:  [96 %-100 %] 99 % (05/16 0449) Weight:  [303 lb 6.4 oz (137.621 kg)] 303 lb 6.4 oz (137.621 kg) (05/15 1143) Physical Exam: General: NAD Cardiovascular: RRR, no murmurs Respiratory: CTAB, NWOB Abdomen: soft, nontender, normoactive BS Extremities: RLE with erythema of distal shin, up to about halfway to knee. TTP over this area. No marked swelling when compared to LLE. 2+DP pulses  bilaterally.  Laboratory: CBC  Recent Labs Lab 01/06/14 1117  WBC 9.4  HGB 13.2  HCT 39.2  PLT 124*     CMET  Recent Labs Lab 01/06/14 1117  NA 141  K 3.9  CL 103  CO2 25  BUN 11  CREATININE 1.33  GLUCOSE 115*  CALCIUM 9.0  AST 22  ALT 21  ALKPHOS 69  PROT 7.5  ALBUMIN 3.4*      Imaging/Diagnostic Tests: LE doppler negative for DVT  Leeanne Rio, MD 01/07/2014, 10:21 AM PGY-2, [place] Intern pager: [phone], text pages welcome

## 2014-01-07 NOTE — Progress Notes (Signed)
FMTS Attending Daily Note:  Annabell Sabal MD  [phone] pager  Family Practice pager:  [phone] I have seen and examined this patient and have reviewed their chart. I have discussed this patient with the resident. I agree with the resident's findings, assessment and care plan.  Additionally:  Didn't actually fail outpt antibiotics.  Admitted from clinic due to fever.  Vanc overnight with some improvement.  Improved swelling and redness today.  Transition to PO antibiotics and watch for next 24 hours.  If demonstrates continued improvement, can possibly DC home tomorrow.    Alveda Reasons, MD 01/07/2014 12:55 PM

## 2014-01-07 NOTE — Progress Notes (Signed)
     Patient ID: Joel West, male   DOB: 03/10/1971, 43 y.o.   MRN: [id number]   Patient presented to [facility] clinic today with history of left leg pain. Patient evaluated and it felt appropriate to admit him to the hospital. Please refer to H&P for documentation of visit.

## 2014-01-07 NOTE — Progress Notes (Signed)
Spoke with pt in regards to CPAP HS--pt not ready to go on at this time, prefers to self administer when ready. RT will assist as needed.

## 2014-01-08 MED ORDER — SULFAMETHOXAZOLE-TMP DS 800-160 MG PO TABS: 1.0000 | ORAL_TABLET | Freq: Two times a day (BID) | ORAL | Status: DC

## 2014-01-08 MED ORDER — POLYETHYLENE GLYCOL 3350 17 G PO PACK
17.0000 g | PACK | Freq: Every day | ORAL | Status: DC | PRN
  Filled 2014-01-08: qty 1

## 2014-01-08 NOTE — Progress Notes (Signed)
Family Medicine Teaching Service Daily Progress Note Intern Pager: [phone]  Patient name: Joel West Medical record number: [id number] Date of birth: 01-23-1971 Age: 43 y.o. Gender: male  Primary Care Provider: Zigmund Gottron, MD Consultants: none Code Status: full code  Pt Overview and Major Events to Date:  5/16: admitted to hospital with LE cellulitis for tx with IV abx 5/17: transitioned to PO bactrim  Assessment and Plan:  Joel West is a 43 y.o. male presenting with cellulitis of right leg. PMH is significant for right leg cellulitis   # Cellulitis, right leg: recurrent episode. Does not cross any joint lines. ESR 92. -Has been on vancomycin per pharmacy, ansitioned to PO bactrim, doing well -Follow-up blood cultures  -tylenol prn pain -ready for d/c home to complete 10 day course of bactrim  # Calf tenderness: most likely related to cellulitis. No symptoms suggesting PE.  -doppler neg for DVT  # Mild AKI: baseline  Cr around 1, noted to be 1.33 on admit, improved yesterday -f/u as outpt  FEN/GI: Regular diet  Prophylaxis: Heparin subq  Disposition: likely d/c home tomorrow if continues to improve on PO abx  Subjective:  Pt states he is feeling well. Tolerating PO. Leg is better, thinks swelling continues to go down. Wants to go home today.  Objective: Temp:  [97.5 F (36.4 C)-99.8 F (37.7 C)] 97.5 F (36.4 C) (05/17 0612) Pulse Rate:  [64-76] 64 (05/17 0612) Resp:  [18] 18 (05/17 0612) BP: (125-143)/(75-84) 143/84 mmHg (05/17 0612) SpO2:  [96 %-99 %] 98 % (05/17 0612) Weight:  [302 lb 0.5 oz (137 kg)] 302 lb 0.5 oz (137 kg) (05/17 0612) Physical Exam: General: NAD Cardiovascular: RRR, no murmurs Respiratory: CTAB, NWOB Abdomen: soft, nontender to palpation Extremities: RLE with erythema of distal shin, less than yesterday. TTP over this area. Leg is still tense when compared to LLE.  Laboratory: CBC  Recent Labs Lab 01/06/14 1117  WBC  9.4  HGB 13.2  HCT 39.2  PLT 124*     CMET  Recent Labs Lab 01/06/14 1117 01/07/14 1135  NA 141 138  K 3.9 3.5*  CL 103 103  CO2 25 23  BUN 11 11  CREATININE 1.33 1.13  GLUCOSE 115* 109*  CALCIUM 9.0 8.8  AST 22  --   ALT 21  --   ALKPHOS 69  --   PROT 7.5  --   ALBUMIN 3.4*  --       Imaging/Diagnostic Tests: LE doppler negative for DVT  Leeanne Rio, MD 01/08/2014, 10:15 AM PGY-2, [place] Intern pager: [phone], text pages welcome

## 2014-01-08 NOTE — Progress Notes (Signed)
Patient was discharged home by MD order; discharged instructions  review and give to patient with care notes; IV DIC; skin intact; patient refused to be escorted by staff.

## 2014-01-08 NOTE — Discharge Summary (Signed)
[facility] Discharge Summary  Patient name: Joel West Medical record number: [id number] Date of birth: June 25, 1971 Age: 43 y.o. Gender: male Date of Admission: 01/06/2014  Date of Discharge: 01/08/14 Admitting Physician: Alveda Reasons, MD  Primary Care Provider: Zigmund Gottron, MD Consultants: none  Indication for Hospitalization: RLE cellulitis  Discharge Diagnoses/Problem List:  RLE cellulitis Fever Mild AKI, improved  Disposition: to home  Discharge Condition: stable  Discharge Exam:  BP 143/84  Pulse 64  Temp(Src) 97.5 F (36.4 C) (Oral)  Resp 18  Ht 5\' 11"  (1.803 m)  Wt 302 lb 0.5 oz (137 kg)  BMI 42.14 kg/m2  SpO2 98% General: NAD  Cardiovascular: RRR, no murmurs  Respiratory: CTAB, NWOB  Abdomen: soft, nontender to palpation  Extremities: RLE with erythema of distal shin, less than yesterday. TTP over this area. Leg is still tense when compared to LLE. Neuro: grossly nonfocal, speech normal  Brief Hospital Course:  Joel West is a 43 y.o. male who was directly admitted to the hospital after presenting to the [facility] with RLE cellulitis. He was febrile in clinic and thus admitted for administration of IV antibiotics. He got three doses of IV vancomycin, with some resultant improvement in his cellulitis. On the day after admission he was transitioned to PO Bactrim and monitored for 24 hours. He had continued improvement and was thus deemed ready for discharge. He remained hemodynamically stable and systemically well throughout his hospitalization. He will complete 10 days total of antibiotic therapy with Bactrim as an outpatient.  Issues for Follow Up:  -ensure improvement of cellulitis on bactrim  Significant Procedures: none  Significant Labs and Imaging:  CBC  Recent Labs Lab 01/06/14 1117  WBC 9.4  HGB 13.2  HCT 39.2  PLT 124*     CMET  Recent Labs Lab 01/06/14 1117 01/07/14 1135  NA 141 138  K 3.9 3.5*   CL 103 103  CO2 25 23  BUN 11 11  CREATININE 1.33 1.13  GLUCOSE 115* 109*  CALCIUM 9.0 8.8  AST 22  --   ALT 21  --   ALKPHOS 69  --   PROT 7.5  --   ALBUMIN 3.4*  --          Results/Tests Pending at Time of Discharge: blood cx x2 no growth to date  Discharge Medications:    Medication List         naproxen sodium 220 MG tablet  Commonly known as:  ANAPROX  Take 220 mg by mouth 2 (two) times daily as needed (for pain).     sulfamethoxazole-trimethoprim 800-160 MG per tablet  Commonly known as:  BACTRIM DS  Take 1 tablet by mouth every 12 (twelve) hours.        Discharge Instructions: Please refer to Patient Instructions section of EMR for full details.  Patient was counseled important signs and symptoms that should prompt return to medical care, changes in medications, dietary instructions, activity restrictions, and follow up appointments.   Follow-Up Appointments:     Follow-up Information   Follow up with Zigmund Gottron, MD On 01/13/2014. (at 9:15am)    Specialty:  Family Medicine   Contact information:   [place] [address] [phone]       Leeanne Rio, MD 01/08/2014, 10:48 AM PGY-2, [facility]

## 2014-01-08 NOTE — Progress Notes (Signed)
FMTS Attending Daily Note:  Annabell Sabal MD  [phone] pager  Family Practice pager:  [phone] I have seen and examined this patient and have reviewed their chart. I have discussed this patient with the resident. I agree with the resident's findings, assessment and care plan.  Additionally:  Improving well.  Plan for DC home with outpt follow-up.  On Doxy  Alveda Reasons, MD 01/08/2014 2:22 PM

## 2014-01-08 NOTE — Discharge Instructions (Signed)
Take bactrim twice daily to complete a 10 day course. You have an appointment with Dr. Andria Frames on Friday at 9:15am to follow up. If swelling worsens, or your leg becomes more red or painful, please follow up sooner in clinic or go to the ER. Elevate your leg when possible, but still get up and move around to avoid getting a blood clot.  Cellulitis Cellulitis is an infection of the skin and the tissue beneath it. The infected area is usually red and tender. Cellulitis occurs most often in the arms and lower legs.  CAUSES  Cellulitis is caused by bacteria that enter the skin through cracks or cuts in the skin. The most common types of bacteria that cause cellulitis are Staphylococcus and Streptococcus. SYMPTOMS   Redness and warmth.  Swelling.  Tenderness or pain.  Fever. DIAGNOSIS  Your caregiver can usually determine what is wrong based on a physical exam. Blood tests may also be done. TREATMENT  Treatment usually involves taking an antibiotic medicine. HOME CARE INSTRUCTIONS   Take your antibiotics as directed. Finish them even if you start to feel better.  Keep the infected arm or leg elevated to reduce swelling.  Apply a warm cloth to the affected area up to 4 times per day to relieve pain.  Only take over-the-counter or prescription medicines for pain, discomfort, or fever as directed by your caregiver.  Keep all follow-up appointments as directed by your caregiver. SEEK MEDICAL CARE IF:   You notice red streaks coming from the infected area.  Your red area gets larger or turns dark in color.  Your bone or joint underneath the infected area becomes painful after the skin has healed.  Your infection returns in the same area or another area.  You notice a swollen bump in the infected area.  You develop new symptoms. SEEK IMMEDIATE MEDICAL CARE IF:   You have a fever.  You feel very sleepy.  You develop vomiting or diarrhea.  You have a general ill feeling  (malaise) with muscle aches and pains. MAKE SURE YOU:   Understand these instructions.  Will watch your condition.  Will get help right away if you are not doing well or get worse. Document Released: 05/21/2005 Document Revised: 02/10/2012 Document Reviewed: 10/27/2011 [facility] Patient Information 2014 [place].

## 2014-01-09 NOTE — Discharge Summary (Signed)
Family Medicine Teaching Service  Discharge Note : Attending Jeff Clayvon Parlett MD Pager [phone] Inpatient Team Pager:  [phone]  I have reviewed this patient and the patient's chart and have discussed discharge planning with the resident at the time of discharge. I agree with the discharge plan as above.    

## 2014-01-12 LAB — CULTURE, BLOOD (ROUTINE X 2)
CULTURE: NO GROWTH
Culture: NO GROWTH

## 2014-01-13 ENCOUNTER — Ambulatory Visit (INDEPENDENT_AMBULATORY_CARE_PROVIDER_SITE_OTHER): Payer: BC Managed Care – PPO | Admitting: Family Medicine

## 2014-01-13 ENCOUNTER — Encounter: Payer: Self-pay | Admitting: Family Medicine

## 2014-01-13 VITALS — BP 129/83 | HR 88 | Temp 98.7°F | Ht 71.0 in | Wt 294.6 lb

## 2014-01-13 DIAGNOSIS — L309 Dermatitis, unspecified: Secondary | ICD-10-CM

## 2014-01-13 DIAGNOSIS — L0390 Cellulitis, unspecified: Secondary | ICD-10-CM

## 2014-01-13 DIAGNOSIS — L259 Unspecified contact dermatitis, unspecified cause: Secondary | ICD-10-CM

## 2014-01-13 DIAGNOSIS — L0291 Cutaneous abscess, unspecified: Secondary | ICD-10-CM

## 2014-01-13 MED ORDER — TRIAMCINOLONE ACETONIDE 0.1 % EX OINT: 1.0000 "application " | TOPICAL_OINTMENT | Freq: Two times a day (BID) | CUTANEOUS | Status: DC

## 2014-01-13 NOTE — Assessment & Plan Note (Signed)
Likely source of entry for cellulitis.  Treat with topical steroids.

## 2014-01-13 NOTE — Progress Notes (Signed)
   Subjective:    Patient ID: Joel West, male    DOB: 06-Oct-1970, 43 y.o.   MRN: [id number]  HPI FU hospitalization right leg cellulitis.  Responding well.  Has eczematous changes (or could be chronic venous insufficiency) of leg.  He thinks scratching led to break in skin and cellulitis.  Denies chronic swelling of leg.    Wants FMLA paperwork but the note from his work states he is not eligible.  He is worried that he will require more time off or that this will be a recurrent problem.  See work excuse note - wants light duty for return to work.   Review of Systems     Objective:   Physical Exam Leg, chronic eczematous changes.  Minimal redness.  Complains of exquisite tenderness to palpation.         Assessment & Plan:

## 2014-01-13 NOTE — Assessment & Plan Note (Signed)
Resolvoing with current Rx.

## 2014-01-13 NOTE — Patient Instructions (Signed)
I gave you the letter. I will be happy to fill out FMLA if you qualify. Keep the eczema under control Good job the weight loss, keep it up.

## 2014-01-17 ENCOUNTER — Telehealth: Payer: Self-pay | Admitting: Family Medicine

## 2014-01-17 NOTE — Telephone Encounter (Signed)
Pt called and needs an adjustment to the letter that was written in needs to say he was incapacitated on the date in question. Please call when ready to pick up. jw

## 2014-01-17 NOTE — Telephone Encounter (Signed)
Spoke with patient and informed him of below 

## 2014-01-17 NOTE — Telephone Encounter (Signed)
Second letter generated.

## 2014-03-15 ENCOUNTER — Emergency Department (HOSPITAL_COMMUNITY)
Admission: EM | Admit: 2014-03-15 | Discharge: 2014-03-15 | Discharge status: Against Medical Advice | Payer: BC Managed Care – PPO | Attending: Emergency Medicine | Admitting: Emergency Medicine

## 2014-03-15 ENCOUNTER — Encounter (HOSPITAL_COMMUNITY): Payer: Self-pay | Admitting: Emergency Medicine

## 2014-03-15 ENCOUNTER — Emergency Department: Payer: Self-pay | Admitting: Emergency Medicine

## 2014-03-15 DIAGNOSIS — K089 Disorder of teeth and supporting structures, unspecified: Secondary | ICD-10-CM | POA: Insufficient documentation

## 2014-03-15 NOTE — ED Notes (Signed)
Pt. reports right upper and lower molar pain unrelieved by prescription Hydrocodone and Ibuprofen , pt. has an appointment with dentist tomorrow.

## 2014-03-23 ENCOUNTER — Telehealth: Payer: Self-pay | Admitting: Family Medicine

## 2014-03-23 DIAGNOSIS — L309 Dermatitis, unspecified: Secondary | ICD-10-CM

## 2014-03-23 NOTE — Telephone Encounter (Signed)
Pt called and needs a refill on his Kenalog cream called in. jw

## 2014-03-24 MED ORDER — TRIAMCINOLONE ACETONIDE 0.1 % EX OINT: 1.0000 "application " | TOPICAL_OINTMENT | Freq: Two times a day (BID) | CUTANEOUS | Status: DC

## 2014-03-24 NOTE — Telephone Encounter (Signed)
Sent rx in, spoke with patient and informed

## 2014-05-08 ENCOUNTER — Telehealth: Payer: Self-pay | Admitting: Family Medicine

## 2014-05-08 DIAGNOSIS — E669 Obesity, unspecified: Secondary | ICD-10-CM

## 2014-05-08 NOTE — Telephone Encounter (Signed)
Pt calls, wanting to speak to Dr. Andria Frames about the lap band. Pls advise.

## 2014-05-09 NOTE — Telephone Encounter (Signed)
Spoke with patient and gave him the number to [facility] [phone], informed him to ask for bariatric surgery

## 2014-05-09 NOTE — Telephone Encounter (Signed)
Wants referral for bariatric surg.  Given BMI = 41 plus sleep apnea, hypertension, referral is reasonable and I have ordered.  Patient knows it is a process and that it starts with him attending an informational session.

## 2014-09-25 ENCOUNTER — Encounter: Payer: Self-pay | Admitting: Family Medicine

## 2014-09-25 ENCOUNTER — Ambulatory Visit (INDEPENDENT_AMBULATORY_CARE_PROVIDER_SITE_OTHER): Payer: Federal, State, Local not specified - PPO | Admitting: Family Medicine

## 2014-09-25 VITALS — BP 150/71 | HR 90 | Temp 98.9°F | Ht 71.0 in | Wt 291.3 lb

## 2014-09-25 DIAGNOSIS — M62830 Muscle spasm of back: Secondary | ICD-10-CM | POA: Diagnosis not present

## 2014-09-25 DIAGNOSIS — M5442 Lumbago with sciatica, left side: Secondary | ICD-10-CM

## 2014-09-25 MED ORDER — MELOXICAM 7.5 MG PO TABS: 7.5000 mg | ORAL_TABLET | Freq: Every day | ORAL | Status: DC

## 2014-09-25 MED ORDER — CYCLOBENZAPRINE HCL 10 MG PO TABS: 10.0000 mg | ORAL_TABLET | Freq: Three times a day (TID) | ORAL | Status: DC | PRN

## 2014-09-25 NOTE — Progress Notes (Signed)
   Subjective:    Patient ID: Joel West, male    DOB: 05-06-1971, 44 y.o.   MRN: [id number]  HPI: Pt presents to clinic with low back pain on both sides with shooting pain down past the knee on the left, and some leg pain on the right around the knee. He works as a Special educational needs teacher. This morning about 3 AM, he had so much pain he had trouble getting out of the bed to go to the bathroom and he took ibuprofen. That helped somewhat, and he was able to go to work but had to leave early. Pain is described as piercing in his back and a "shock" in his left leg. He denies frank weakness / numbness, loss of bowel / bladder control.  He states he wore different shoes yesterday that he thinks "threw him off," and was doing some lifting yesterday but this is not out of the ordinary, and he felt no pulls or pops while working. He had similar symptoms about 4-5 years ago and went to a chiropractor, which helped somewhat.  Review of Systems: As above. Denies fever / chills, N/V, abdominal pain, coryza-type symptoms.     Objective:   Physical Exam BP 150/71 mmHg  Pulse 90  Temp(Src) 98.9 F (37.2 C) (Oral)  Ht 5\' 11"  (1.803 m)  Wt 291 lb 4.8 oz (132.133 kg)  BMI 40.65 kg/m2 Gen: adult male in NAD, moves slowly secondary to pain HEENT: /AT, EOMI, PERRLA Cardio: RRR, no murmur appreciated Pulm: CTAB, no wheezes Abd: soft, nontender, BS+ Ext: warm, well-perfused MSK: marked diffuse lumbar spine tenderness without gross deformity in spine  Very marked paraspinal muscle tenderness / tightness bilaterally, in low thoracic and lumbar regions  Sitting straight-leg lift test negative on the right, equivocal for reproducing radicular-type pain on the left Neuro: alert, oriented, grossly normal strength (4+/5 bilaterally in LE due to pain) and normal sensation Gait antalgic but pt stands / changes positions / ambulates without assistance     Assessment & Plan:  44yo male with low back pain bilaterally,  reported sciatica-type symptoms on the left, and muscle spasm of the low back - no red flags on history or exam - Rx for Mobic 7.5 mg daily scheduled for 1-2 weeks, then daily PRN - Rx for Flexeril 10 mg TID PRN (reviewed side effects, recommended taking at bedtime, etc) - advised pt to f/u with chiropractor if he chooses but definitely to try exercises given to him from his previous chiropractor - work note provided for today - reviewed red flags and advised close f/u either here or in the ED, as needed  Emmaline Kluver, MD PGY-3, [facility] 09/25/2014, 3:05 PM

## 2014-09-25 NOTE — Patient Instructions (Signed)
Thank you for coming in, today!  I think you have pulled muscles and possibly a pinched nerve in your back. I want you to take an antiinflammatory medicine called Mobic (meloxicam), 7.5 mg every day for 1-2 weeks. After that, you can take it once a day as needed. DO NOT take meloxicam and other "NSAID" medicines (ibuprofen / Motrin, Aleve / naproxen, etc).  You can also take Flexeril (cyclobenzaprine) 10 mg up to 3 times per day as needed for muscle spasm. DO NOT take this medicine and drive within about 6 hours. It may make you very sleepy especially the first few times you take it, so take it first before bedtime.  If you would like to see a chiropractor again, that would be reasonable. It probably would also help to use the exercises from the last time the chiropractor saw you.  If you are feeling no better in the next couple of days, or if you are feeling worse, call or come back to see [place]. Come back to see [place] as needed, otherwise.  Please feel free to call with any questions or concerns at any time, at [phone]. --Dr. Venetia Maxon

## 2014-09-26 ENCOUNTER — Telehealth: Payer: Self-pay | Admitting: Family Medicine

## 2014-09-26 NOTE — Telephone Encounter (Signed)
Needs to be seen.  Not good care to treat a rash/infection without verifying the diagnosis.

## 2014-09-26 NOTE — Telephone Encounter (Signed)
Cellulitis is flairing up again  Can soemthing be called in? [place]

## 2014-09-27 MED ORDER — DOXYCYCLINE HYCLATE 100 MG PO TABS: 100.0000 mg | ORAL_TABLET | Freq: Two times a day (BID) | ORAL | Status: DC

## 2014-09-27 NOTE — Telephone Encounter (Signed)
Patient calling again, upset that he has to come in. Wants to know 'if MD that he saw yesterday' will call in medication for him,otherwise he would like a call back from Dr. Andria Frames.

## 2014-09-27 NOTE — Telephone Encounter (Signed)
Against my better judgment, I called patient and will prescribe.  Story is that he has had cellulitis before.  Seen 2 days ago.  The leg was swollen at the time but back pain was the issue.  Now red and he "knows" he has early cellulitis.  I told him I was making an exception and that the rule is we don't call in antibiotics over the phone.

## 2014-09-27 NOTE — Telephone Encounter (Signed)
Pt states he was here on Monday.  He says he knows the symptons and has had this 3 times. He doesn't want to go back in the hospital because he cant get the med "it dont make no sense"

## 2014-09-27 NOTE — Telephone Encounter (Signed)
Pt had no complaints of rash / cellulitis when I saw him and I did not notice any rashes. I will not be able to call in anything, either. Thanks. --CMS

## 2014-09-28 ENCOUNTER — Telehealth: Payer: Self-pay | Admitting: Family Medicine

## 2014-09-28 MED ORDER — METHOCARBAMOL 500 MG PO TABS: 500.0000 mg | ORAL_TABLET | Freq: Three times a day (TID) | ORAL | Status: DC

## 2014-09-28 NOTE — Telephone Encounter (Signed)
Dear Joel West team, He was seen recently by Dr. Venetia Maxon for this back pain some going to Route that note to him. [name]

## 2014-09-28 NOTE — Telephone Encounter (Signed)
Please let pt know he can increase Mobic to two 7.5 mg pills daily (i.e., increase to 15 mg total daily). This will help with pain but for his spasms I will e-prescribe Robaxin, 500 mg three times daily as needed for spasms (similar to medication to Flexeril but might work better). He SHOULD NOT take both Flexeril and Robaxin. If he's not having any relief in the next week or so he'll need to be seen in clinic again. Thanks. --CMS

## 2014-09-28 NOTE — Telephone Encounter (Signed)
Spoke with pt and informed him of below.  Instructed him to finish the day with the flexeril and the start robaxin tomorrow if he is able to get rx. [name], [place]

## 2014-09-28 NOTE — Telephone Encounter (Signed)
Patient still experiencing tightness to back muscles.  Wanted to speak with provider regarding getting something else or increasing the amount of medication taking daily.

## 2014-09-29 ENCOUNTER — Telehealth: Payer: Self-pay | Admitting: Family Medicine

## 2014-09-29 NOTE — Telephone Encounter (Signed)
Per Dr. Ree Kida, pt must be seen in office for cellulitis diagnosis in order to be excused from work. Scheduled pt an appt for Monday am.

## 2014-09-29 NOTE — Telephone Encounter (Signed)
Pt was seen recently by Dr. Andria Frames, says he is taking an antibiotic for cellulitis, when he wakes up swelling goes down but when he works his leg swells up again. Wants to know if MD wants him to be out of work for a few days?

## 2014-10-02 ENCOUNTER — Ambulatory Visit (HOSPITAL_COMMUNITY)
Admission: RE | Admit: 2014-10-02 | Discharge: 2014-10-02 | Disposition: A | Discharge status: Home / Self Care | Payer: Federal, State, Local not specified - PPO | Source: Ambulatory Visit | Attending: Family Medicine | Admitting: Family Medicine

## 2014-10-02 ENCOUNTER — Ambulatory Visit (INDEPENDENT_AMBULATORY_CARE_PROVIDER_SITE_OTHER): Payer: Federal, State, Local not specified - PPO | Admitting: Family Medicine

## 2014-10-02 ENCOUNTER — Encounter: Payer: Self-pay | Admitting: Family Medicine

## 2014-10-02 VITALS — BP 147/90 | HR 74 | Temp 97.9°F | Ht 71.0 in | Wt 292.7 lb

## 2014-10-02 DIAGNOSIS — E669 Obesity, unspecified: Secondary | ICD-10-CM | POA: Insufficient documentation

## 2014-10-02 DIAGNOSIS — M7989 Other specified soft tissue disorders: Secondary | ICD-10-CM

## 2014-10-02 DIAGNOSIS — L03115 Cellulitis of right lower limb: Secondary | ICD-10-CM | POA: Diagnosis not present

## 2014-10-02 NOTE — Telephone Encounter (Signed)
Against my better judgment, I was talked into prescribing antibiotic over the phone.  We cannot provide a work excuse since he was not seen.  Joel West has a history of pushing such limits.

## 2014-10-02 NOTE — Patient Instructions (Signed)
It was nice to meet you today!  Checking ultrasound of your leg to be sure you don't have a blood clot. Continue doxycycline, finish out this course of antibiotic. Return if not getting better or if it gets worse Keep leg elevated when able Wrote you a note for work  Be well, Dr. Ardelia Mems

## 2014-10-02 NOTE — Progress Notes (Signed)
Right lower extremity venous duplex completed.  Right:  No evidence of DVT, superficial thrombosis, or Baker's cyst.  Left:  Negative for DVT in the common femoral vein.  

## 2014-10-02 NOTE — Progress Notes (Signed)
Patient ID: Joel West, male   DOB: 1970-09-07, 44 y.o.   MRN: [id number]  HPI:  Pt presents for a same day appointment to discuss right lower extremity cellulitis.  Patient was treated with doxycycline over the phone by his PCP, for reported symptoms which were consistent with prior episodes of cellulitis. The symptoms initially began last Wednesday. He had redness and swelling. He's been taking the doxycycline twice a day for 4-5 days thus far. Overall feels better. Has not had any fevers. He has presented today because he needs a note for work saying that he can sit down while he is at work. He notices that when he stands for prolonged periods at work that the swelling re-accumulates. He denies any chest pain or shortness of breath. No prior history of VTE.  ROS: See HPI  [place]: Prior cellulitis, hyperlipidemia, OSA  PHYSICAL EXAM: BP 147/90 mmHg  Pulse 74  Temp(Src) 97.9 F (36.6 C) (Oral)  Ht 5\' 11"  (1.803 m)  Wt 292 lb 11.2 oz (132.768 kg)  BMI 40.84 kg/m2 Gen: No acute distress, pleasant, cooperative HEENT: Normocephalic, atraumatic Lungs: Normal respiratory effort Neuro: Grossly nonfocal, speech normal Extremities: Right lower extremity with swelling and tenderness to palpation on the anterior/medial aspect of the distal shin. Negative Homans. 2+ DP pulse. Ankle and foot also seem swollen on the side. No significant warmth when compared to the other side.  ASSESSMENT/PLAN:  1. Right lower extremity cellulitis: Improving on oral doxycycline. Persistent swelling does raise slight concern for additional pathology including DVT. - Continue doxycycline twice daily to complete 10 day course - Check lower extremity Doppler to rule out DVT - Given work note asking that he be allowed to sit down at work for the next week so that he can keep his leg elevated - Return if worsening or not improving  FOLLOW UP: F/u as needed if symptoms worsen or do not improve.   [place].  Ardelia Mems, [place]

## 2014-10-03 ENCOUNTER — Telehealth: Payer: Self-pay | Admitting: Family Medicine

## 2014-10-03 ENCOUNTER — Encounter: Payer: Self-pay | Admitting: *Deleted

## 2014-10-03 NOTE — Telephone Encounter (Signed)
Pt called and needs [place] to fax a letter to his employer stating that he can return to normal duty at work. His leg is fine. Please fax this to [phone]. Blima Rich

## 2014-10-03 NOTE — Telephone Encounter (Signed)
This appears to have been done.

## 2014-10-03 NOTE — Telephone Encounter (Signed)
Letter printed and faxed to [phone]

## 2014-10-03 NOTE — Telephone Encounter (Signed)
Pt called back. He needs a letter stating he can go back to full duty today. He is at work now

## 2014-10-10 ENCOUNTER — Other Ambulatory Visit: Payer: Self-pay | Admitting: Family Medicine

## 2014-10-10 DIAGNOSIS — L309 Dermatitis, unspecified: Secondary | ICD-10-CM

## 2014-10-10 MED ORDER — TRIAMCINOLONE ACETONIDE 0.1 % EX OINT: 1.0000 "application " | TOPICAL_OINTMENT | Freq: Two times a day (BID) | CUTANEOUS | Status: DC

## 2014-10-10 NOTE — Telephone Encounter (Signed)
Pt called because his leg is still swelling up and the cream he use for this is out and he would like a refill. jw

## 2014-10-11 ENCOUNTER — Encounter: Payer: Self-pay | Admitting: Family Medicine

## 2014-10-11 NOTE — Progress Notes (Signed)
Placed in PCP box for completion 

## 2014-10-11 NOTE — Progress Notes (Signed)
Patient dropped off FMLA forms to be filled out.  Please mail to company when completed.

## 2014-10-12 NOTE — Progress Notes (Signed)
Patient ID: Joel West, male   DOB: 1970-10-23, 44 y.o.   MRN: [id number] Form completed

## 2014-10-12 NOTE — Progress Notes (Signed)
Pt informed that FMLA forms are complete and mailed to company as requested.  Copies made for patient and to scan in record.  Derl Barrow, RN

## 2015-02-09 ENCOUNTER — Telehealth: Payer: Self-pay | Admitting: Family Medicine

## 2015-02-09 ENCOUNTER — Emergency Department (HOSPITAL_COMMUNITY)
Admission: EM | Admit: 2015-02-09 | Discharge: 2015-02-09 | Disposition: A | Discharge status: Home / Self Care | Payer: Federal, State, Local not specified - PPO | Source: Home / Self Care | Attending: Family Medicine | Admitting: Family Medicine

## 2015-02-09 ENCOUNTER — Encounter (HOSPITAL_COMMUNITY): Payer: Self-pay | Admitting: Emergency Medicine

## 2015-02-09 DIAGNOSIS — M5136 Other intervertebral disc degeneration, lumbar region: Secondary | ICD-10-CM | POA: Diagnosis not present

## 2015-02-09 DIAGNOSIS — G8929 Other chronic pain: Secondary | ICD-10-CM

## 2015-02-09 DIAGNOSIS — S39012A Strain of muscle, fascia and tendon of lower back, initial encounter: Secondary | ICD-10-CM | POA: Diagnosis not present

## 2015-02-09 DIAGNOSIS — M545 Low back pain, unspecified: Secondary | ICD-10-CM

## 2015-02-09 DIAGNOSIS — M51369 Other intervertebral disc degeneration, lumbar region without mention of lumbar back pain or lower extremity pain: Secondary | ICD-10-CM

## 2015-02-09 MED ORDER — NAPROXEN SODIUM 220 MG PO TABS: 220.0000 mg | ORAL_TABLET | Freq: Two times a day (BID) | ORAL | Status: DC | PRN

## 2015-02-09 MED ORDER — KETOROLAC TROMETHAMINE 10 MG PO TABS: ORAL_TABLET | ORAL | Status: DC

## 2015-02-09 MED ORDER — CYCLOBENZAPRINE HCL 5 MG PO TABS: 5.0000 mg | ORAL_TABLET | Freq: Three times a day (TID) | ORAL | Status: DC | PRN

## 2015-02-09 NOTE — Telephone Encounter (Signed)
Pt called and would like a refill on his Naproxen called in. jw

## 2015-02-09 NOTE — ED Provider Notes (Signed)
CSN: [id number]     Arrival date & time 02/09/15  1655 History   First MD Initiated Contact with Patient 02/09/15 1845     No chief complaint on file.  (Consider location/radiation/quality/duration/timing/severity/associated sxs/prior Treatment) HPI Comments: 44 year old male complaining of acute on chronic low back pain. Yesterday it flared up on him again he states. He is complaining of back spasms primarily along the lower lumbar spine and left para spinal musculature. Denies any known injury. He states that he was diagnosed with degenerative disc disease some years ago. He has been taking Flexeril with only partial relief and only has 1 tablet left.   Past Medical History  Diagnosis Date  . Headache(784.0)   . Cellulitis 12/2013    RT LEG  . Sleep apnea     USES CPAP  . Hyperlipidemia    No past surgical history on file. No family history on file. History  Substance Use Topics  . Smoking status: Never Smoker   . Smokeless tobacco: Never Used  . Alcohol Use: 3.0 - 4.0 oz/week    6-8 drink(s) per week    Review of Systems  Constitutional: Positive for activity change. Negative for fever and fatigue.  Respiratory: Negative.   Gastrointestinal: Negative.   Genitourinary: Negative.   Musculoskeletal: Positive for myalgias and back pain. Negative for joint swelling and neck pain.       As per HPI  Skin: Negative.   Neurological: Negative for weakness, numbness and headaches.    Allergies  Review of patient's allergies indicates no known allergies.  Home Medications   Prior to Admission medications   Medication Sig Start Date End Date Taking? Authorizing Provider  cyclobenzaprine (FLEXERIL) 5 MG tablet Take 1 tablet (5 mg total) by mouth 3 (three) times daily as needed for muscle spasms. 02/09/15   Janne Napoleon, NP  doxycycline (VIBRA-TABS) 100 MG tablet Take 1 tablet (100 mg total) by mouth 2 (two) times daily. 09/27/14   Zenia Resides, MD  ketorolac (TORADOL) 10 MG tablet  Take 2 tablets now, then 1 q 6 hours prn pain 02/09/15   Janne Napoleon, NP  meloxicam (MOBIC) 7.5 MG tablet Take 1 tablet (7.5 mg total) by mouth daily. Every day for 1-2 weeks, then daily as needed. 09/25/14   [place], MD  methocarbamol (ROBAXIN) 500 MG tablet Take 1 tablet (500 mg total) by mouth 3 (three) times daily. 09/28/14   [place], MD  naproxen sodium (ANAPROX) 220 MG tablet Take 1 tablet (220 mg total) by mouth 2 (two) times daily as needed (for pain). 02/09/15   Zenia Resides, MD  triamcinolone ointment (KENALOG) 0.1 % Apply 1 application topically 2 (two) times daily. For eczema.  Not on face. 10/10/14   Zenia Resides, MD   BP 141/79 mmHg  Pulse 61  Temp(Src) 98.7 F (37.1 C) (Oral)  Resp 16  SpO2 100% Physical Exam  Constitutional: He is oriented to person, place, and time. He appears well-developed and well-nourished. No distress.  HENT:  Head: Normocephalic and atraumatic.  Eyes: EOM are normal. Left eye exhibits no discharge.  Neck: Normal range of motion. Neck supple.  Pulmonary/Chest: No respiratory distress.  Musculoskeletal:    While sitting,  palpation of the area of pain in the low back is nontender. Having the patient stand and in palpating the same area produces no pain. Having him bend forward to approximately 30 causes him to have back pain in the lower lumbar area. Palpation  does not reveal muscle spasms or knottiness. There is tenderness over the lower lumbar spine but no palpable deformities, swelling or discoloration.  Neurological: He is alert and oriented to person, place, and time. No cranial nerve deficit.  Skin: Skin is warm and dry.  Psychiatric: He has a normal mood and affect.  Nursing note and vitals reviewed.   ED Course  Procedures (including critical care time) Labs Review Labs Reviewed - No data to display  Imaging Review No results found.   MDM   1. Chronic low back pain   2. DDD (degenerative disc disease),  lumbar   3. Lumbar strain, initial encounter    Flexeril 5 mg  toradol 10 mg as dir Heat, stretches See your doctor next week.    Janne Napoleon, NP 02/09/15 Einar Crow

## 2015-02-09 NOTE — ED Notes (Deleted)
Neck pain noticed yesterday, no particular injury.  Pain in left side of neck.  Reports using goody powders, muscle relaxer

## 2015-02-09 NOTE — ED Notes (Signed)
Patient reports chronic back issues.  This particular episode of pain started yesterday.

## 2015-02-09 NOTE — Discharge Instructions (Signed)
Lumbosacral Strain Lumbosacral strain is a strain of any of the parts that make up your lumbosacral vertebrae. Your lumbosacral vertebrae are the bones that make up the lower third of your backbone. Your lumbosacral vertebrae are held together by muscles and tough, fibrous tissue (ligaments).  CAUSES  A sudden blow to your back can cause lumbosacral strain. Also, anything that causes an excessive stretch of the muscles in the low back can cause this strain. This is typically seen when people exert themselves strenuously, fall, lift heavy objects, bend, or crouch repeatedly. RISK FACTORS  Physically demanding work.  Participation in pushing or pulling sports or sports that require a sudden twist of the back (tennis, golf, baseball).  Weight lifting.  Excessive lower back curvature.  Forward-tilted pelvis.  Weak back or abdominal muscles or both.  Tight hamstrings. SIGNS AND SYMPTOMS  Lumbosacral strain may cause pain in the area of your injury or pain that moves (radiates) down your leg.  DIAGNOSIS Your health care provider can often diagnose lumbosacral strain through a physical exam. In some cases, you may need tests such as X-ray exams.  TREATMENT  Treatment for your lower back injury depends on many factors that your clinician will have to evaluate. However, most treatment will include the use of anti-inflammatory medicines. HOME CARE INSTRUCTIONS   Avoid hard physical activities (tennis, racquetball, waterskiing) if you are not in proper physical condition for it. This may aggravate or create problems.  If you have a back problem, avoid sports requiring sudden body movements. Swimming and walking are generally safer activities.  Maintain good posture.  Maintain a healthy weight.  For acute conditions, you may put ice on the injured area.  Put ice in a plastic bag.  Place a towel between your skin and the bag.  Leave the ice on for 20 minutes, 2-3 times a day.  When the  low back starts healing, stretching and strengthening exercises may be recommended. SEEK MEDICAL CARE IF:  Your back pain is getting worse.  You experience severe back pain not relieved with medicines. SEEK IMMEDIATE MEDICAL CARE IF:   You have numbness, tingling, weakness, or problems with the use of your arms or legs.  There is a change in bowel or bladder control.  You have increasing pain in any area of the body, including your belly (abdomen).  You notice shortness of breath, dizziness, or feel faint.  You feel sick to your stomach (nauseous), are throwing up (vomiting), or become sweaty.  You notice discoloration of your toes or legs, or your feet get very cold. MAKE SURE YOU:   Understand these instructions.  Will watch your condition.  Will get help right away if you are not doing well or get worse. Document Released: 05/21/2005 Document Revised: 08/16/2013 Document Reviewed: 03/30/2013 [facility] Patient Information 2015 [place], [place]. This information is not intended to replace advice given to you by your health care provider. Make sure you discuss any questions you have with your health care provider.  Muscle Strain A muscle strain (pulled muscle) happens when a muscle is stretched beyond normal length. It happens when a sudden, violent force stretches your muscle too far. Usually, a few of the fibers in your muscle are torn. Muscle strain is common in athletes. Recovery usually takes 1-2 weeks. Complete healing takes 5-6 weeks.  HOME CARE   Follow the PRICE method of treatment to help your injury get better. Do this the first 2-3 days after the injury:  Protect. Protect the  muscle to keep it from getting injured again.  Rest. Limit your activity and rest the injured body part.  Ice. Put ice in a plastic bag. Place a towel between your skin and the bag. Then, apply the ice and leave it on from 15-20 minutes each hour. After the third day, switch to moist heat  packs.  Compression. Use a splint or elastic bandage on the injured area for comfort. Do not put it on too tightly.  Elevate. Keep the injured body part above the level of your heart.  Only take medicine as told by your doctor.  Warm up before doing exercise to prevent future muscle strains. GET HELP IF:   You have more pain or puffiness (swelling) in the injured area.  You feel numbness, tingling, or notice a loss of strength in the injured area. MAKE SURE YOU:   Understand these instructions.  Will watch your condition.  Will get help right away if you are not doing well or get worse. Document Released: 05/20/2008 Document Revised: 06/01/2013 Document Reviewed: 03/10/2013 [facility] Patient Information 2015 [place], [place]. This information is not intended to replace advice given to you by your health care provider. Make sure you discuss any questions you have with your health care provider.  Heat Therapy Heat therapy can help make painful, stiff muscles and joints feel better. Do not use heat on new injuries. Wait at least 48 hours after an injury to use heat. Do not use heat when you have aches or pains right after an activity. If you still have pain 3 hours after stopping the activity, then you may use heat. HOME CARE Wet heat pack  Soak a clean towel in warm water. Squeeze out the extra water.  Put the warm, wet towel in a plastic bag.  Place a thin, dry towel between your skin and the bag.  Put the heat pack on the area for 5 minutes, and check your skin. Your skin may be pink, but it should not be red.  Leave the heat pack on the area for 15 to 30 minutes.  Repeat this every 2 to 4 hours while awake. Do not use heat while you are sleeping. Warm water bath  Fill a tub with warm water.  Place the affected body part in the tub.  Soak the area for 20 to 40 minutes.  Repeat as needed. Hot water bottle  Fill the water bottle half full with hot water.  Press out  the extra air. Close the cap tightly.  Place a dry towel between your skin and the bottle.  Put the bottle on the area for 5 minutes, and check your skin. Your skin may be pink, but it should not be red.  Leave the bottle on the area for 15 to 30 minutes.  Repeat this every 2 to 4 hours while awake. Electric heating pad  Place a dry towel between your skin and the heating pad.  Set the heating pad on low heat.  Put the heating pad on the area for 10 minutes, and check your skin. Your skin may be pink, but it should not be red.  Leave the heating pad on the area for 20 to 40 minutes.  Repeat this every 2 to 4 hours while awake.  Do not lie on the heating pad.  Do not fall asleep while using the heating pad.  Do not use the heating pad near water. GET HELP RIGHT AWAY IF:  You get blisters or red  skin.  Your skin is puffy (swollen), or you lose feeling (numbness) in the affected area.  You have any new problems.  Your problems are getting worse.  You have any questions or concerns. If you have any problems, stop using heat therapy until you see your doctor. MAKE SURE YOU:  Understand these instructions.  Will watch your condition.  Will get help right away if you are not doing well or get worse. Document Released: 11/03/2011 Document Reviewed: 10/04/2013 [facility] Patient Information 2015 [place]. This information is not intended to replace advice given to you by your health care provider. Make sure you discuss any questions you have with your health care provider.  Degenerative Disk Disease Degenerative disk disease is a condition caused by the changes that occur in the cushions of the backbone (spinal disks) as you grow older. Spinal disks are soft and compressible disks located between the bones of the spine (vertebrae). They act like shock absorbers. Degenerative disk disease can affect the whole spine. However, the neck and lower back are most commonly  affected. Many changes can occur in the spinal disks with aging, such as:  The spinal disks may dry and shrink.  Small tears may occur in the tough, outer covering of the disk (annulus).  The disk space may become smaller due to loss of water.  Abnormal growths in the bone (spurs) may occur. This can put pressure on the nerve roots exiting the spinal canal, causing pain.  The spinal canal may become narrowed. CAUSES  Degenerative disk disease is a condition caused by the changes that occur in the spinal disks with aging. The exact cause is not known, but there is a genetic basis for many patients. Degenerative changes can occur due to loss of fluid in the disk. This makes the disk thinner and reduces the space between the backbones. Small cracks can develop in the outer layer of the disk. This can lead to the breakdown of the disk. You are more likely to get degenerative disk disease if you are overweight. Smoking cigarettes and doing heavy work such as weightlifting can also increase your risk of this condition. Degenerative changes can start after a sudden injury. Growth of bone spurs can compress the nerve roots and cause pain.  SYMPTOMS  The symptoms vary from person to person. Some people may have no pain, while others have severe pain. The pain may be so severe that it can limit your activities. The location of the pain depends on the part of your backbone that is affected. You will have neck or arm pain if a disk in the neck area is affected. You will have pain in your back, buttocks, or legs if a disk in the lower back is affected. The pain becomes worse while bending, reaching up, or with twisting movements. The pain may start gradually and then get worse as time passes. It may also start after a major or minor injury. You may feel numbness or tingling in the arms or legs.  DIAGNOSIS  Your caregiver will ask you about your symptoms and about activities or habits that may cause the pain. He  or she may also ask about any injuries, diseases, or treatments you have had earlier. Your caregiver will examine you to check for the range of movement that is possible in the affected area, to check for strength in your extremities, and to check for sensation in the areas of the arms and legs supplied by different nerve roots. An  X-ray of the spine may be taken. Your caregiver may suggest other imaging tests, such as magnetic resonance imaging (MRI), if needed.  TREATMENT  Treatment includes rest, modifying your activities, and applying ice and heat. Your caregiver may prescribe medicines to reduce your pain and may ask you to do some exercises to strengthen your back. In some cases, you may need surgery. You and your caregiver will decide on the treatment that is best for you. HOME CARE INSTRUCTIONS   Follow proper lifting and walking techniques as advised by your caregiver.  Maintain good posture.  Exercise regularly as advised.  Perform relaxation exercises.  Change your sitting, standing, and sleeping habits as advised. Change positions frequently.  Lose weight as advised.  Stop smoking if you smoke.  Wear supportive footwear. SEEK MEDICAL CARE IF:  Your pain does not go away within 1 to 4 weeks. SEEK IMMEDIATE MEDICAL CARE IF:   Your pain is severe.  You notice weakness in your arms, hands, or legs.  You begin to lose control of your bladder or bowel movements. MAKE SURE YOU:   Understand these instructions.  Will watch your condition.  Will get help right away if you are not doing well or get worse. Document Released: 06/08/2007 Document Revised: 11/03/2011 Document Reviewed: 12/13/2013 [facility] Patient Information 2015 [place], [place]. This information is not intended to replace advice given to you by your health care provider. Make sure you discuss any questions you have with your health care provider.

## 2015-02-13 ENCOUNTER — Telehealth: Payer: Self-pay | Admitting: Family Medicine

## 2015-02-13 NOTE — Telephone Encounter (Signed)
Needs appointment with [place] before we can order additional/different treatment.

## 2015-02-13 NOTE — Telephone Encounter (Signed)
[facility] with back spasms Was given muscle relaxer and pain med Doesn't think either one is working Please advise

## 2015-02-13 NOTE — Telephone Encounter (Signed)
Pt states that he is due for a physical so he scheduled appt for Friday (02/16/15). Angi Goodell, Salome Spotted

## 2015-02-16 ENCOUNTER — Encounter: Payer: Self-pay | Admitting: Family Medicine

## 2015-02-16 ENCOUNTER — Other Ambulatory Visit (HOSPITAL_COMMUNITY)
Admission: RE | Admit: 2015-02-16 | Discharge: 2015-02-16 | Disposition: A | Discharge status: Home / Self Care | Payer: Federal, State, Local not specified - PPO | Source: Ambulatory Visit | Attending: Family Medicine | Admitting: Family Medicine

## 2015-02-16 ENCOUNTER — Ambulatory Visit (HOSPITAL_COMMUNITY)
Admission: RE | Admit: 2015-02-16 | Discharge: 2015-02-16 | Disposition: A | Discharge status: Home / Self Care | Payer: Federal, State, Local not specified - PPO | Source: Ambulatory Visit | Attending: Family Medicine | Admitting: Family Medicine

## 2015-02-16 ENCOUNTER — Ambulatory Visit (INDEPENDENT_AMBULATORY_CARE_PROVIDER_SITE_OTHER): Payer: Federal, State, Local not specified - PPO | Admitting: Family Medicine

## 2015-02-16 VITALS — BP 125/76 | HR 70 | Temp 98.0°F | Ht 71.0 in | Wt 299.0 lb

## 2015-02-16 DIAGNOSIS — Z202 Contact with and (suspected) exposure to infections with a predominantly sexual mode of transmission: Secondary | ICD-10-CM | POA: Diagnosis not present

## 2015-02-16 DIAGNOSIS — M5416 Radiculopathy, lumbar region: Secondary | ICD-10-CM

## 2015-02-16 DIAGNOSIS — Z113 Encounter for screening for infections with a predominantly sexual mode of transmission: Secondary | ICD-10-CM

## 2015-02-16 DIAGNOSIS — M79605 Pain in left leg: Secondary | ICD-10-CM | POA: Insufficient documentation

## 2015-02-16 DIAGNOSIS — M5417 Radiculopathy, lumbosacral region: Secondary | ICD-10-CM | POA: Diagnosis not present

## 2015-02-16 DIAGNOSIS — M47896 Other spondylosis, lumbar region: Secondary | ICD-10-CM | POA: Diagnosis not present

## 2015-02-16 DIAGNOSIS — M545 Low back pain: Secondary | ICD-10-CM | POA: Diagnosis not present

## 2015-02-16 LAB — HIV ANTIBODY (ROUTINE TESTING W REFLEX): HIV 1&2 Ab, 4th Generation: NONREACTIVE

## 2015-02-16 LAB — HEPATITIS C ANTIBODY: HCV Ab: NEGATIVE

## 2015-02-16 MED ORDER — IBUPROFEN 800 MG PO TABS: 800.0000 mg | ORAL_TABLET | Freq: Three times a day (TID) | ORAL | Status: DC | PRN

## 2015-02-16 MED ORDER — CYCLOBENZAPRINE HCL 10 MG PO TABS: 10.0000 mg | ORAL_TABLET | Freq: Three times a day (TID) | ORAL | Status: DC | PRN

## 2015-02-16 NOTE — Patient Instructions (Signed)
I will be happy to fill out your FMLA paperwork I want to get a set of X rays of your low back. I sent in two prescriptions cyclobenzaprene is a muscle relaxer.   Ibuprofen is a pain and inflammation drug. There are no great answers to low back pain.  Learn to deal with it.

## 2015-02-17 LAB — RPR

## 2015-02-19 LAB — URINE CYTOLOGY ANCILLARY ONLY
CHLAMYDIA, DNA PROBE: NEGATIVE
Neisseria Gonorrhea: NEGATIVE

## 2015-02-19 NOTE — Assessment & Plan Note (Signed)
Will get X rays since I have none.  This seems to be typical low back pain without any red flag symptoms.

## 2015-02-19 NOTE — Assessment & Plan Note (Signed)
Check labs per his request.

## 2015-02-19 NOTE — Progress Notes (Signed)
   Subjective:    Patient ID: Joel West, male    DOB: 1970/10/15, 44 y.o.   MRN: [id number]  HPI  Not annual exam.  Here for back pain, which has become a chronic recurrent problem for him.  I have not evaluated in the past nor can I access any previous x rays.  He complains of mostly lower lumbar pain with some radiation to the left hip and thigh.  No weakness.  No trauma.  Works at the [employer] and this flair came when he was bending over.  Treated with NSAIDs and flexeril - he is disappointed in his response to these meds.  He is getting slowly better and wants to return to work.  Needs doctors note.  Also will bring by [facility] papers for me to fill out regarding possible future recurrences.  He was out of work 5 days with this episode.  As I was wrapping up, he stated he also wanted to be checked for STDs.  Denied symptoms.  Just worriend about possible exposure.    Review of Systems     Objective:   Physical ExamLower lumbar pain and some spasm. Normal strength, sensation and DTRs in both lower ext.        Assessment & Plan:

## 2015-07-20 ENCOUNTER — Ambulatory Visit (INDEPENDENT_AMBULATORY_CARE_PROVIDER_SITE_OTHER): Payer: Federal, State, Local not specified - PPO | Admitting: Family Medicine

## 2015-07-20 ENCOUNTER — Ambulatory Visit

## 2015-07-20 ENCOUNTER — Encounter: Payer: Self-pay | Admitting: Family Medicine

## 2015-07-20 ENCOUNTER — Ambulatory Visit (INDEPENDENT_AMBULATORY_CARE_PROVIDER_SITE_OTHER): Admitting: Family Medicine

## 2015-07-20 VITALS — BP 118/60 | HR 105 | Temp 100.8°F | Resp 18 | Ht 70.0 in | Wt 295.0 lb

## 2015-07-20 DIAGNOSIS — S39011A Strain of muscle, fascia and tendon of abdomen, initial encounter: Secondary | ICD-10-CM

## 2015-07-20 DIAGNOSIS — R509 Fever, unspecified: Secondary | ICD-10-CM | POA: Diagnosis not present

## 2015-07-20 DIAGNOSIS — R1030 Lower abdominal pain, unspecified: Secondary | ICD-10-CM | POA: Diagnosis not present

## 2015-07-20 DIAGNOSIS — R1031 Right lower quadrant pain: Secondary | ICD-10-CM

## 2015-07-20 DIAGNOSIS — L03115 Cellulitis of right lower limb: Secondary | ICD-10-CM | POA: Diagnosis not present

## 2015-07-20 LAB — POCT CBC
Granulocyte percent: 91 %G — AB (ref 37–80)
HEMATOCRIT: 42.8 % — AB (ref 43.5–53.7)
Hemoglobin: 14.1 g/dL (ref 14.1–18.1)
LYMPH, POC: 0.7 (ref 0.6–3.4)
MCH, POC: 29.7 pg (ref 27–31.2)
MCHC: 32.9 g/dL (ref 31.8–35.4)
MCV: 90.2 fL (ref 80–97)
MID (CBC): 0.4 (ref 0–0.9)
MPV: 7.2 fL (ref 0–99.8)
POC GRANULOCYTE: 10.6 — AB (ref 2–6.9)
POC LYMPH %: 5.8 % — AB (ref 10–50)
POC MID %: 3.2 % (ref 0–12)
Platelet Count, POC: 158 10*3/uL (ref 142–424)
RBC: 4.75 M/uL (ref 4.69–6.13)
RDW, POC: 13.6 %
WBC: 11.7 10*3/uL — AB (ref 4.6–10.2)

## 2015-07-20 MED ORDER — DOXYCYCLINE HYCLATE 100 MG PO TABS: 100.0000 mg | ORAL_TABLET | Freq: Two times a day (BID) | ORAL | Status: DC

## 2015-07-20 NOTE — Progress Notes (Addendum)
Subjective:  This chart was scribed for Joel Ray, MD by Leandra Kern, Medical Scribe. This patient was seen in Room 14 and the patient's care was started at 3:40 PM.   Patient ID: Joel West, male    DOB: 31-Oct-1970, 44 y.o.   MRN: [id number]  Chief Complaint  Patient presents with  . Fever  . Cellulitis    HPI HPI Comments: Joel West is a 44 y.o. male who presents to [facility] complaining of possible cellulitis in the right leg.  Pt was initially seen for W/C today, but noted had elevated temp, swelling and erythema of his leg. Pt has a hx of recurrent cellulitis. This has been treated in the past with doxycycline. PCP is Zigmund Gottron, MD.  Today, pt notes having a history of 3 episodes of Cellulitis for which he had to be hospitalized for. His last episode was in the right leg from which he is still experiencing warmth and erythema of the area since his diagnosis of it in February. Pt is also experiencing mild swelling of the area that has not completely resolved since his last episode. Pt presents today with an elevated temperature of 100.8, and reports chills symptoms during the past few days, in addition to sick contact at work. Pt denies previous fever, cough, or congestion.  Pt is has not been vaccinated for the flu, and is not interested in receiving it in the future.     Patient Active Problem List   Diagnosis Date Noted  . Lumbar back pain with radiculopathy affecting left lower extremity 02/16/2015  . Exposure to STD 03/11/2013  . Gynecomastia, male 03/11/2013  . Allergic rhinitis 03/11/2013  . Eczema 10/22/2012  . Obstructive sleep apnea 10/22/2012  . Elevated blood pressure (not hypertension) 10/22/2012  . HYPERCHOLESTEROLEMIA 10/22/2006  . OBESITY, NOS 10/22/2006   Past Medical History  Diagnosis Date  . Headache(784.0)   . Cellulitis 12/2013    RT LEG  . Sleep apnea     USES CPAP  . Hyperlipidemia    No past  surgical history on file. No Known Allergies Prior to Admission medications   Medication Sig Start Date End Date Taking? Authorizing Provider  ibuprofen (ADVIL,MOTRIN) 800 MG tablet Take 1 tablet (800 mg total) by mouth every 8 (eight) hours as needed. 02/16/15  Yes Zenia Resides, MD  cyclobenzaprine (FLEXERIL) 10 MG tablet Take 1 tablet (10 mg total) by mouth 3 (three) times daily as needed for muscle spasms. Patient not taking: Reported on 07/20/2015 02/16/15   Zenia Resides, MD  doxycycline (VIBRA-TABS) 100 MG tablet Take 1 tablet (100 mg total) by mouth 2 (two) times daily. Patient not taking: Reported on 07/20/2015 09/27/14   Zenia Resides, MD  triamcinolone ointment (KENALOG) 0.1 % Apply 1 application topically 2 (two) times daily. For eczema.  Not on face. Patient not taking: Reported on 07/20/2015 10/10/14   Zenia Resides, MD   Social History   Social History  . Marital Status: Single    Spouse Name: N/A  . Number of Children: N/A  . Years of Education: N/A   Occupational History  . Not on file.   Social History Main Topics  . Smoking status: Never Smoker   . Smokeless tobacco: Never Used  . Alcohol Use: 3.0 - 4.0 oz/week    6-8 drink(s) per week  . Drug Use: No  . Sexual Activity: Not on file   Other Topics Concern  .  Not on file   Social History Narrative    Review of Systems  Constitutional: Positive for fever.  HENT: Negative for congestion.   Respiratory: Negative for cough.   Cardiovascular: Positive for leg swelling.  Skin: Positive for color change.       Objective:   Physical Exam  Constitutional: He is oriented to person, place, and time. He appears well-developed and well-nourished. No distress.  HENT:  Head: Normocephalic and atraumatic.  Eyes: EOM are normal. Pupils are equal, round, and reactive to light.  Neck: Neck supple.  Cardiovascular: Normal rate.   Pulses:      Dorsalis pedis pulses are 2+ on the right side.  NVI distally.     Pulmonary/Chest: Effort normal.  Musculoskeletal:  Faint erythema with soft tissue swelling from the ankle to the tibial tuberosity with some stasis hyperpigmentaion. There are no open wounds. There is appreciable warmth of that leg compared to his left.  Right ankle FROM. Right knee FROM, non tender.    Neurological: He is alert and oriented to person, place, and time. No cranial nerve deficit.  Skin: Skin is warm and dry. There is erythema.  Warmth and erythema in the right leg.  Psychiatric: He has a normal mood and affect. His behavior is normal.  Nursing note and vitals reviewed.   Filed Vitals:   07/20/15 1440  BP: 118/60  Pulse: 105  Temp: 100.8 F (38.2 C)  TempSrc: Oral  Resp: 18  Height: 5\' 10"  (1.778 m)  Weight: 295 lb (133.811 kg)  SpO2: 98%   Results for orders placed or performed in visit on 07/20/15  POCT CBC  Result Value Ref Range   WBC 11.7 (A) 4.6 - 10.2 K/uL   Lymph, poc 0.7 0.6 - 3.4   POC LYMPH PERCENT 5.8 (A) 10 - 50 %L   MID (cbc) 0.4 0 - 0.9   POC MID % 3.2 0 - 12 %M   POC Granulocyte 10.6 (A) 2 - 6.9   Granulocyte percent 91.0 (A) 37 - 80 %G   RBC 4.75 4.69 - 6.13 M/uL   Hemoglobin 14.1 14.1 - 18.1 g/dL   HCT, POC 42.8 (A) 43.5 - 53.7 %   MCV 90.2 80 - 97 fL   MCH, POC 29.7 27 - 31.2 pg   MCHC 32.9 31.8 - 35.4 g/dL   RDW, POC 13.6 %   Platelet Count, POC 158 142 - 424 K/uL   MPV 7.2 0 - 99.8 fL       Assessment & Plan:   Joel West is a 44 y.o. male Cellulitis of leg, right - Plan: POCT CBC, DISCONTINUED: doxycycline (VIBRA-TABS) 100 MG tablet  Fever, unspecified - Plan: POCT CBC, DISCONTINUED: doxycycline (VIBRA-TABS) 100 MG tablet  Recurrent cellulitis, RLE. Now with low grade fever/borderline leukocytosis.   -restart doxycycline 100mg  BID 10 day course. Sx care with leg elevation.   -follow up with [place] or PCP in 3 days, sooner if fevers worsen, spread of erythema or other worsening.  Understanding expressed.   Meds ordered this  encounter  Medications  . DISCONTD: doxycycline (VIBRA-TABS) 100 MG tablet    Sig: Take 1 tablet (100 mg total) by mouth 2 (two) times daily.    Dispense:  20 tablet    Refill:  0  . doxycycline (VIBRA-TABS) 100 MG tablet    Sig: Take 1 tablet (100 mg total) by mouth 2 (two) times daily.    Dispense:  20 tablet  Refill:  0   Patient Instructions  Start antibiotic, elevate leg when seated and follow up with your primary provider Monday.  If fevers or worsening over the weekend - return here or emergency room.   Cellulitis Cellulitis is an infection of the skin and the tissue beneath it. The infected area is usually red and tender. Cellulitis occurs most often in the arms and lower legs.  CAUSES  Cellulitis is caused by bacteria that enter the skin through cracks or cuts in the skin. The most common types of bacteria that cause cellulitis are staphylococci and streptococci. SIGNS AND SYMPTOMS   Redness and warmth.  Swelling.  Tenderness or pain.  Fever. DIAGNOSIS  Your health care provider can usually determine what is wrong based on a physical exam. Blood tests may also be done. TREATMENT  Treatment usually involves taking an antibiotic medicine. HOME CARE INSTRUCTIONS   Take your antibiotic medicine as directed by your health care provider. Finish the antibiotic even if you start to feel better.  Keep the infected arm or leg elevated to reduce swelling.  Apply a warm cloth to the affected area up to 4 times per day to relieve pain.  Take medicines only as directed by your health care provider.  Keep all follow-up visits as directed by your health care provider. SEEK MEDICAL CARE IF:   You notice red streaks coming from the infected area.  Your red area gets larger or turns dark in color.  Your bone or joint underneath the infected area becomes painful after the skin has healed.  Your infection returns in the same area or another area.  You notice a swollen bump  in the infected area.  You develop new symptoms.  You have a fever. SEEK IMMEDIATE MEDICAL CARE IF:   You feel very sleepy.  You develop vomiting or diarrhea.  You have a general ill feeling (malaise) with muscle aches and pains.   This information is not intended to replace advice given to you by your health care provider. Make sure you discuss any questions you have with your health care provider.   Document Released: 05/21/2005 Document Revised: 05/02/2015 Document Reviewed: 10/27/2011 Elsevier Interactive Patient Education [employer].        By signing my name below, I, Rawaa Al Rifaie, attest that this documentation has been prepared under the direction and in the presence of Joel Ray, MD.  Royann Shivers Rifaie, Medical Scribe. 07/20/2015.  3:47 PM.  I personally performed the services described in this documentation, which was scribed in my presence. The recorded information has been reviewed and considered, and addended by me as needed.

## 2015-07-20 NOTE — Patient Instructions (Signed)
Start antibiotic, elevate leg when seated and follow up with your primary provider Monday.  If fevers or worsening over the weekend - return here or emergency room.   Cellulitis Cellulitis is an infection of the skin and the tissue beneath it. The infected area is usually red and tender. Cellulitis occurs most often in the arms and lower legs.  CAUSES  Cellulitis is caused by bacteria that enter the skin through cracks or cuts in the skin. The most common types of bacteria that cause cellulitis are staphylococci and streptococci. SIGNS AND SYMPTOMS   Redness and warmth.  Swelling.  Tenderness or pain.  Fever. DIAGNOSIS  Your health care provider can usually determine what is wrong based on a physical exam. Blood tests may also be done. TREATMENT  Treatment usually involves taking an antibiotic medicine. HOME CARE INSTRUCTIONS   Take your antibiotic medicine as directed by your health care provider. Finish the antibiotic even if you start to feel better.  Keep the infected arm or leg elevated to reduce swelling.  Apply a warm cloth to the affected area up to 4 times per day to relieve pain.  Take medicines only as directed by your health care provider.  Keep all follow-up visits as directed by your health care provider. SEEK MEDICAL CARE IF:   You notice red streaks coming from the infected area.  Your red area gets larger or turns dark in color.  Your bone or joint underneath the infected area becomes painful after the skin has healed.  Your infection returns in the same area or another area.  You notice a swollen bump in the infected area.  You develop new symptoms.  You have a fever. SEEK IMMEDIATE MEDICAL CARE IF:   You feel very sleepy.  You develop vomiting or diarrhea.  You have a general ill feeling (malaise) with muscle aches and pains.   This information is not intended to replace advice given to you by your health care provider. Make sure you discuss any  questions you have with your health care provider.   Document Released: 05/21/2005 Document Revised: 05/02/2015 Document Reviewed: 10/27/2011 Elsevier Interactive Patient Education [employer].

## 2015-07-20 NOTE — Progress Notes (Signed)
Subjective:  This chart was scribed for Joel Ray, MD by Leandra Kern, Medical Scribe. This patient was seen in Room 14 and the patient's care was started at 2:09 PM.    Patient ID: Joel West, male    DOB: September 08, 1970, 44 y.o.   MRN: [id number]  Chief Complaint  Patient presents with  . OTHER    WC initial    HPI HPI Comments: [name] is a 44 y.o. male who presents to [facility] complaining of a groin injury that occurred today.  Pt notes that he was pulling an object backwards with a dolley and believes that her pulled a muscle in his right groin area. Pt took Ibuprofen 800 for the pain. Pt denies weakness in the leg, fever, penile pain, or any previous history of groin pain.   Pt has a history of cellulitis in his leg. Pt also presents with an elevated temperature of 100.8. Pt was advised to be seen acutely by the provider here for this issue separate form his WC complaint.   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   No Known Allergies Current Outpatient Prescriptions on File Prior to Visit  Medication Sig Dispense Refill  . cyclobenzaprine (FLEXERIL) 10 MG tablet Take 1 tablet (10 mg total) by mouth 3 (three) times daily as needed for muscle spasms. 60 tablet 3  . doxycycline (VIBRA-TABS) 100 MG tablet Take 1 tablet (100 mg total) by mouth 2 (two) times daily. 20 tablet 0  . ibuprofen (ADVIL,MOTRIN) 800 MG tablet Take 1 tablet (800 mg total) by mouth every 8 (eight) hours as needed. 100 tablet 6  . triamcinolone ointment (KENALOG) 0.1 % Apply 1 application topically 2 (two) times daily. For eczema.  Not on face. (Patient not taking: Reported on 07/20/2015) 80 g 6   No current facility-administered medications on file prior to visit.     Review of Systems  Constitutional: Positive for fever. Negative for chills.  Genitourinary: Negative for penile swelling and penile pain.  Musculoskeletal: Positive for myalgias.  Skin: Positive for  color change.      Objective:   Physical Exam  Constitutional: He is oriented to person, place, and time. He appears well-developed and well-nourished. No distress.  HENT:  Head: Normocephalic and atraumatic.  Eyes: EOM are normal. Pupils are equal, round, and reactive to light.  Neck: Neck supple.  Cardiovascular: Normal rate, regular rhythm and normal heart sounds.  Exam reveals no gallop and no friction rub.   No murmur heard. Pulses:      Dorsalis pedis pulses are 2+ on the right side.       Posterior tibial pulses are 2+ on the right side.  NVI distally.   Pulmonary/Chest: Effort normal.  Musculoskeletal:  Right ankle FROM. Right knee FROM, non tender.   Neurological: He is alert and oriented to person, place, and time. No cranial nerve deficit.  Skin: Skin is warm and dry. There is erythema.  Warmth and erythema in the right leg.   Psychiatric: He has a normal mood and affect. His behavior is normal.  Nursing note and vitals reviewed.   Filed Vitals:   07/20/15 1315  BP: 118/60  Pulse: 105  Temp: 100.8 F (38.2 C)  TempSrc: Oral  Resp: 18  Height: 5\' 10"  (1.778 m)  Weight: 295 lb (133.811 kg)  SpO2: 98%    [facility] (PRIMARY) x-West report read by Helmut Muster, MD: Pelvis-  No apparent  avulsion or acute bony findings.     Assessment & Plan:  [name] is a 44 y.o. male Right groin pain - Plan: DG Pelvis 1-2 Views  Strain of groin, initial encounter - Plan: DG Pelvis 1-2 Views  - adductor strain on right due to injury at work.  Mild sx's at present, anticipate fairly quick recovery, but will initially avoid activities that could exacerbate pain. See letter for work. Recheck in 4 days. Sooner if worse.   -fever - as above - recommended eval with PCP/care provider.   No orders of the defined types were placed in this encounter.   Patient Instructions  Ibuprofen up to every 6-8 hours with food. Stretches and range of motion as discussed, but restrictions for  work until follow up in 4 days.  Return to the clinic or go to the nearest emergency room if any of your symptoms worsen or new symptoms occur.  Groin Strain A groin strain (also called a groin pull) is an injury to the muscles or tendon on the upper inner part of the thigh. These muscles are called the adductor muscles or groin muscles. They are responsible for moving the leg across the body. A muscle strain occurs when a muscle is overstretched and some muscle fibers are torn. A groin strain can range from mild to severe depending on how many muscle fibers are affected and whether the muscle fibers are partially or completely torn.  Groin strains usually occur during exercise or participation in sports. The injury often happens when a sudden, violent force is placed on a muscle, stretching the muscle too far. A strain is more likely to occur when your muscles are not warmed up or if you are not properly conditioned. Depending on the severity of the groin strain, recovery time may vary from a few weeks to several weeks. Severe injuries often require 4-6 weeks for recovery. In these cases, complete healing can take 4-5 months.  CAUSES   Stretching the groin muscles too far or too suddenly, often during side-to-side motion with an abrupt change in direction.  Putting repeated stress on the groin muscles over a long period of time.  Performing vigorous activity without properly stretching the groin muscles beforehand. SYMPTOMS   Pain and tenderness in the groin area. This begins as sharp pain and persists as a dull ache.  Popping or snapping feeling when the injury occurs (for severe strains).  Swelling or bruising.  Muscle spasms.  Weakness in the leg.  Stiffness in the groin area with decreased ability to move the affected muscles. DIAGNOSIS  Your caregiver will perform a physical exam to diagnose a groin strain. You will be asked about your symptoms and how the injury occurred. X-rays are  sometimes needed to rule out a broken bone or cartilage problems. Your caregiver may order a CT scan or MRI if a complete muscle tear is suspected. TREATMENT  A groin strain will often heal on its own. Your caregiver may prescribe medicines to help manage pain and swelling (anti-inflammatory medicine). You may be told to use crutches for the first few days to minimize your pain. HOME CARE INSTRUCTIONS   Rest. Do not use the strained muscle if it causes pain.  Put ice on the injured area.  Put ice in a plastic bag.  Place a towel between your skin and the bag.  Leave the ice on for 15-20 minutes, every 2-3 hours. Do this for the first 2 days after the injury.  Only take over-the-counter or prescription medicines as directed by your caregiver.  Wrap the injured area with an elastic bandage as directed by your caregiver.  Keep the injured leg raised (elevated).  Walk, stretch, and perform range-of-motion exercises to improve blood flow to the injured area. Only perform these activities if you can do so without any pain. To prevent muscle strains:  Warm up before exercise.  Develop proper conditioning and strength in the groin muscles. SEEK IMMEDIATE MEDICAL CARE IF:   You have increased pain or swelling in the affected area.   Your symptoms are not improving or are getting worse. MAKE SURE YOU:   Understand these instructions.  Will watch your condition.  Will get help right away if you are not doing well or get worse.   This information is not intended to replace advice given to you by your health care provider. Make sure you discuss any questions you have with your health care provider.   Document Released: 04/08/2004 Document Revised: 07/28/2012 Document Reviewed: 04/14/2012 Elsevier Interactive Patient Education [employer].        By signing my name below, I, Rawaa Al Rifaie, attest that this documentation has been prepared under the direction and in the  presence of Joel Ray, MD.  Royann Shivers Rifaie, Medical Scribe. 07/20/2015.  2:24 PM. I personally performed the services described in this documentation, which was scribed in my presence. The recorded information has been reviewed and considered, and addended by me as needed.

## 2015-07-20 NOTE — Patient Instructions (Signed)
Ibuprofen up to every 6-8 hours with food. Stretches and range of motion as discussed, but restrictions for work until follow up in 4 days.  Return to the clinic or go to the nearest emergency room if any of your symptoms worsen or new symptoms occur.  Groin Strain A groin strain (also called a groin pull) is an injury to the muscles or tendon on the upper inner part of the thigh. These muscles are called the adductor muscles or groin muscles. They are responsible for moving the leg across the body. A muscle strain occurs when a muscle is overstretched and some muscle fibers are torn. A groin strain can range from mild to severe depending on how many muscle fibers are affected and whether the muscle fibers are partially or completely torn.  Groin strains usually occur during exercise or participation in sports. The injury often happens when a sudden, violent force is placed on a muscle, stretching the muscle too far. A strain is more likely to occur when your muscles are not warmed up or if you are not properly conditioned. Depending on the severity of the groin strain, recovery time may vary from a few weeks to several weeks. Severe injuries often require 4-6 weeks for recovery. In these cases, complete healing can take 4-5 months.  CAUSES   Stretching the groin muscles too far or too suddenly, often during side-to-side motion with an abrupt change in direction.  Putting repeated stress on the groin muscles over a long period of time.  Performing vigorous activity without properly stretching the groin muscles beforehand. SYMPTOMS   Pain and tenderness in the groin area. This begins as sharp pain and persists as a dull ache.  Popping or snapping feeling when the injury occurs (for severe strains).  Swelling or bruising.  Muscle spasms.  Weakness in the leg.  Stiffness in the groin area with decreased ability to move the affected muscles. DIAGNOSIS  Your caregiver will perform a physical  exam to diagnose a groin strain. You will be asked about your symptoms and how the injury occurred. X-rays are sometimes needed to rule out a broken bone or cartilage problems. Your caregiver may order a CT scan or MRI if a complete muscle tear is suspected. TREATMENT  A groin strain will often heal on its own. Your caregiver may prescribe medicines to help manage pain and swelling (anti-inflammatory medicine). You may be told to use crutches for the first few days to minimize your pain. HOME CARE INSTRUCTIONS   Rest. Do not use the strained muscle if it causes pain.  Put ice on the injured area.  Put ice in a plastic bag.  Place a towel between your skin and the bag.  Leave the ice on for 15-20 minutes, every 2-3 hours. Do this for the first 2 days after the injury.  Only take over-the-counter or prescription medicines as directed by your caregiver.  Wrap the injured area with an elastic bandage as directed by your caregiver.  Keep the injured leg raised (elevated).  Walk, stretch, and perform range-of-motion exercises to improve blood flow to the injured area. Only perform these activities if you can do so without any pain. To prevent muscle strains:  Warm up before exercise.  Develop proper conditioning and strength in the groin muscles. SEEK IMMEDIATE MEDICAL CARE IF:   You have increased pain or swelling in the affected area.   Your symptoms are not improving or are getting worse. MAKE SURE YOU:   Understand  these instructions.  Will watch your condition.  Will get help right away if you are not doing well or get worse.   This information is not intended to replace advice given to you by your health care provider. Make sure you discuss any questions you have with your health care provider.   Document Released: 04/08/2004 Document Revised: 07/28/2012 Document Reviewed: 04/14/2012 Elsevier Interactive Patient Education [employer].

## 2015-07-23 ENCOUNTER — Ambulatory Visit (INDEPENDENT_AMBULATORY_CARE_PROVIDER_SITE_OTHER): Admitting: Family Medicine

## 2015-07-23 VITALS — BP 122/80 | HR 74 | Temp 98.6°F | Resp 18 | Ht 70.0 in | Wt 286.0 lb

## 2015-07-23 DIAGNOSIS — S76211D Strain of adductor muscle, fascia and tendon of right thigh, subsequent encounter: Secondary | ICD-10-CM

## 2015-07-23 NOTE — Progress Notes (Signed)
   Subjective:    Patient ID: Joel West, male    DOB: 1971-02-17, 44 y.o.   MRN: [id number] This chart was scribed for Joel Cheadle, MD by Zola Button, Medical Scribe. This patient was seen in Room 11 and the patient's care was started at 12:04 PM.   Chief Complaint  Patient presents with  . Follow-up    groin injury      HPI HPI Comments: Joel West is a 44 y.o. male who presents to the [facility] for a worker's comp follow-up for a groin injury. He had a right groin strain 3 days ago. Pelvic XR showed no changes at the pelvis, but showed buttressing of the right femoral head which may be contributing to his pain.  Patient notes his groin pain has completely resolved and he requests to return to work asap. He denies problems with his right hip now or prior. He works out at [employer] and walks for exercise. Patient has been diagnosed with arthritis in his spine but no other MSK problems.   PCP: Dr. Andria West   Review of Systems  Constitutional: Negative for activity change and appetite change.  Cardiovascular: Negative for leg swelling.  Genitourinary: Negative for genital sores.  Musculoskeletal: Negative for myalgias, back pain, joint swelling, arthralgias and gait problem.  Skin: Negative for rash.  Allergic/Immunologic: Negative for immunocompromised state.  Neurological: Negative for weakness and numbness.  Hematological: Negative for adenopathy. Does not bruise/bleed easily.       Objective:  BP 122/80 mmHg  Pulse 74  Temp(Src) 98.6 F (37 C) (Oral)  Resp 18  Ht 5\' 10"  (1.778 m)  Wt 286 lb (129.729 kg)  BMI 41.04 kg/m2  SpO2 98%  Physical Exam  Constitutional: He is oriented to person, place, and time. He appears well-developed and well-nourished. No distress.  HENT:  Head: Normocephalic and atraumatic.  Mouth/Throat: Oropharynx is clear and moist. No oropharyngeal exudate.  Eyes: Pupils are equal, round, and reactive to light.  Neck: Neck supple.    Cardiovascular: Normal rate.   Pulmonary/Chest: Effort normal.  Musculoskeletal: He exhibits no edema.  Neurological: He is alert and oriented to person, place, and time. No cranial nerve deficit.  Skin: Skin is warm and dry. No rash noted.  Psychiatric: He has a normal mood and affect. His behavior is normal.  Nursing note and vitals reviewed.         Assessment & Plan:   1. Groin strain, right, subsequent encounter   Resolved, ok to RTW full time w/o restrictions.  Xray incidentally noted femoral neck buttressing which apparently occurs in response to increased joint forces at the hip, which might be due to overuse or a mildly increased congential hip angle and may contribute to more chronic pain or dysfunction - pt not currently or prev having any sxs so requests that we inform his PCP and can f/u there prn  I personally performed the services described in this documentation, which was scribed in my presence. The recorded information has been reviewed and considered, and addended by me as needed.  Joel Cheadle, MD MPH    By signing my name below, I, Zola Button, attest that this documentation has been prepared under the direction and in the presence of Joel Cheadle, MD.  Electronically Signed: Zola Button, Medical Scribe. 07/23/2015. 12:04 PM.   Send chart to PCP, Dr. Andria West, as FYI on hip abnormality. Follow-up with PCP as needed.

## 2015-09-15 ENCOUNTER — Ambulatory Visit (INDEPENDENT_AMBULATORY_CARE_PROVIDER_SITE_OTHER): Payer: Federal, State, Local not specified - PPO | Admitting: Physician Assistant

## 2015-09-15 VITALS — BP 120/80 | HR 64 | Temp 98.4°F | Resp 16 | Ht 70.0 in | Wt 293.0 lb

## 2015-09-15 DIAGNOSIS — L03011 Cellulitis of right finger: Secondary | ICD-10-CM

## 2015-09-15 DIAGNOSIS — L03012 Cellulitis of left finger: Secondary | ICD-10-CM | POA: Diagnosis not present

## 2015-09-15 DIAGNOSIS — B353 Tinea pedis: Secondary | ICD-10-CM | POA: Diagnosis not present

## 2015-09-15 DIAGNOSIS — IMO0001 Reserved for inherently not codable concepts without codable children: Secondary | ICD-10-CM

## 2015-09-15 MED ORDER — DOXYCYCLINE HYCLATE 100 MG PO TABS: 100.0000 mg | ORAL_TABLET | Freq: Two times a day (BID) | ORAL | Status: DC

## 2015-09-15 MED ORDER — TERBINAFINE HCL 250 MG PO TABS: 250.0000 mg | ORAL_TABLET | Freq: Every day | ORAL | Status: DC

## 2015-09-15 NOTE — Progress Notes (Signed)
   Joel West  MRN: [id number] DOB: 03-25-1971  Subjective:  Pt presents to clinic with 2 concerns 1- left middle finger had a hang nail yesterday and then today it is swollen and painful - he has been using green ETOH but it has not been helping.  He has had no fevers or chills.  He was seen 11/16 for a right leg cellulitis which he has had multiple times but the skin on his legs has not gotten back to normal color - it is still darker than normal and swollen compared to the left side - he thinks he might need more abx to make it go back to normal.  Patient Active Problem List   Diagnosis Date Noted  . Lumbar back pain with radiculopathy affecting left lower extremity 02/16/2015  . Exposure to STD 03/11/2013  . Gynecomastia, male 03/11/2013  . Allergic rhinitis 03/11/2013  . Eczema 10/22/2012  . Obstructive sleep apnea 10/22/2012  . Elevated blood pressure (not hypertension) 10/22/2012  . HYPERCHOLESTEROLEMIA 10/22/2006  . OBESITY, NOS 10/22/2006    No current outpatient prescriptions on file prior to visit.   No current facility-administered medications on file prior to visit.    No Known Allergies  Review of Systems  Constitutional: Negative for fever and chills.   Objective:  BP 120/80 mmHg  Pulse 64  Temp(Src) 98.4 F (36.9 C) (Oral)  Resp 16  Ht 5\' 10"  (1.778 m)  Wt 293 lb (132.904 kg)  BMI 42.04 kg/m2  SpO2 98%  Physical Exam  Constitutional: He is oriented to person, place, and time and well-developed, well-nourished, and in no distress.  HENT:  Head: Normocephalic and atraumatic.  Right Ear: External ear normal.  Left Ear: External ear normal.  Eyes: Conjunctivae are normal.  Neck: Normal range of motion.  Pulmonary/Chest: Effort normal.  Neurological: He is alert and oriented to person, place, and time. Gait normal.  Skin: Skin is warm and dry.  Left 3rd digit with a lateral paronychia with swelling to the PIP - finger is green from green ETOH -  TTP  Right lower leg - woody appearance to the medial leg - larger than the left side - scaling rash on bottom of right foot - consistent with tinea pedis  Psychiatric: Mood, memory, affect and judgment normal.   Procedure: Consent obtained - 2% lido MC block on left 3rd digit - betadine prep afeter anesthesia obtained - #11 blade used to make an incision into the paronychia - purulence expressed from the area - culture obtained.  Drsg placed  Assessment and Plan :  Paronychia of third finger, left - Plan: terbinafine (LAMISIL) 250 MG tablet  Tinea pedis of right foot - Plan: doxycycline (VIBRA-TABS) 100 MG tablet   Wound care d/w pt.  We will start abx for the paronychia - the skin changes on his leg are most likely related to PVD and possible the tinea pedis that is present on that foot and we will treat with lamisil after his is finished with his abx.  He will f/u with his PCP for further evaluation of PVD.   Windell Hummingbird PA-C  [facility] 09/15/2015 1:57 PM

## 2015-09-15 NOTE — Patient Instructions (Addendum)
Warm compresses - change dressing daily and do not allow to stay wet  Start to doxycyline and then when it is finished please start the lamisil.  Peripheral Vascular Disease -  Peripheral vascular disease (PVD) is a disease of the blood vessels that are not part of your heart and brain. A simple term for PVD is poor circulation. In most cases, PVD narrows the blood vessels that carry blood from your heart to the rest of your body. This can result in a decreased supply of blood to your arms, legs, and internal organs, like your stomach or kidneys. However, it most often affects a person's lower legs and feet. There are two types of PVD.  Organic PVD. This is the more common type. It is caused by damage to the structure of blood vessels.  Functional PVD. This is caused by conditions that make blood vessels contract and tighten (spasm). Without treatment, PVD tends to get worse over time. PVD can also lead to acute ischemic limb. This is when an arm or limb suddenly has trouble getting enough blood. This is a medical emergency. CAUSES Each type of PVD has many different causes. The most common cause of PVD is buildup of a fatty material (plaque) inside of your arteries (atherosclerosis). Small amounts of plaque can break off from the walls of the blood vessels and become lodged in a smaller artery. This blocks blood flow and can cause acute ischemic limb. Other common causes of PVD include:  Blood clots that form inside of blood vessels.  Injuries to blood vessels.  Diseases that cause inflammation of blood vessels or cause blood vessel spasms.  Health behaviors and health history that increase your risk of developing PVD. RISK FACTORS  You may have a greater risk of PVD if you:  Have a family history of PVD.  Have certain medical conditions, including:  High cholesterol.  Diabetes.  High blood pressure (hypertension).  Coronary heart disease.  Past problems with blood  clots.  Past injury, such as burns or a broken bone. These may have damaged blood vessels in your limbs.  Buerger disease. This is caused by inflamed blood vessels in your hands and feet.  Some forms of arthritis.  Rare birth defects that affect the arteries in your legs.  Use tobacco.  Do not get enough exercise.  Are obese.  Are age 46 or older. SIGNS AND SYMPTOMS  PVD may cause many different symptoms. Your symptoms depend on what part of your body is not getting enough blood. Some common signs and symptoms include:  Cramps in your lower legs. This may be a symptom of poor leg circulation (claudication).  Pain and weakness in your legs while you are physically active that goes away when you rest (intermittent claudication).  Leg pain when at rest.  Leg numbness, tingling, or weakness.  Coldness in a leg or foot, especially when compared with the other leg.  Skin or hair changes. These can include:  Hair loss.  Shiny skin.  Pale or bluish skin.  Thick toenails.  Inability to get or maintain an erection (erectile dysfunction). People with PVD are more prone to developing ulcers and sores on their toes, feet, or legs. These may take longer than normal to heal. DIAGNOSIS Your health care provider may diagnose PVD from your signs and symptoms. The health care provider will also do a physical exam. You may have tests to find out what is causing your PVD and determine its severity. Tests may include:  Blood pressure recordings from your arms and legs and measurements of the strength of your pulses (pulse volume recordings).  Imaging studies using sound waves to take pictures of the blood flow through your blood vessels (Doppler ultrasound).  Injecting a dye into your blood vessels before having imaging studies using:  X-rays (angiogram or arteriogram).  Computer-generated X-rays (CT angiogram).  A powerful electromagnetic field and a computer (magnetic resonance  angiogram or MRA). TREATMENT Treatment for PVD depends on the cause of your condition and the severity of your symptoms. It also depends on your age. Underlying causes need to be treated and controlled. These include long-lasting (chronic) conditions, such as diabetes, high cholesterol, and high blood pressure. You may need to first try making lifestyle changes and taking medicines. Surgery may be needed if these do not work. Lifestyle changes may include:  Quitting smoking.  Exercising regularly.  Following a low-fat, low-cholesterol diet. Medicines may include:  Blood thinners to prevent blood clots.  Medicines to improve blood flow.  Medicines to improve your blood cholesterol levels. Surgical procedures may include:  A procedure that uses an inflated balloon to open a blocked artery and improve blood flow (angioplasty).  A procedure to put in a tube (stent) to keep a blocked artery open (stent implant).  Surgery to reroute blood flow around a blocked artery (peripheral bypass surgery).  Surgery to remove dead tissue from an infected wound on the affected limb.  Amputation. This is surgical removal of the affected limb. This may be necessary in cases of acute ischemic limb that are not improved through medical or surgical treatments. HOME CARE INSTRUCTIONS  Take medicines only as directed by your health care provider.  Do not use any tobacco products, including cigarettes, chewing tobacco, or electronic cigarettes. If you need help quitting, ask your health care provider.  Lose weight if you are overweight, and maintain a healthy weight as directed by your health care provider.  Eat a diet that is low in fat and cholesterol. If you need help, ask your health care provider.  Exercise regularly. Ask your health care provider to suggest some good activities for you.  Use compression stockings or other mechanical devices as directed by your health care provider.  Take good  care of your feet.  Wear comfortable shoes that fit well.  Check your feet often for any cuts or sores. SEEK MEDICAL CARE IF:  You have cramps in your legs while walking.  You have leg pain when you are at rest.  You have coldness in a leg or foot.  Your skin changes.  You have erectile dysfunction.  You have cuts or sores on your feet that are not healing. SEEK IMMEDIATE MEDICAL CARE IF:  Your arm or leg turns cold and blue.  Your arms or legs become red, warm, swollen, painful, or numb.  You have chest pain or trouble breathing.  You suddenly have weakness in your face, arm, or leg.  You become very confused or lose the ability to speak.  You suddenly have a very bad headache or lose your vision.   This information is not intended to replace advice given to you by your health care provider. Make sure you discuss any questions you have with your health care provider.   Document Released: 09/18/2004 Document Revised: 09/01/2014 Document Reviewed: 01/19/2014 Elsevier Interactive Patient Education [employer].

## 2015-09-18 LAB — WOUND CULTURE

## 2015-10-12 ENCOUNTER — Telehealth: Payer: Self-pay | Admitting: Family Medicine

## 2015-10-12 NOTE — Telephone Encounter (Signed)
Not interested in getting flu shot

## 2015-11-23 DIAGNOSIS — J449 Chronic obstructive pulmonary disease, unspecified: Secondary | ICD-10-CM | POA: Diagnosis not present

## 2015-11-28 ENCOUNTER — Telehealth: Payer: Self-pay | Admitting: Family Medicine

## 2015-11-28 NOTE — Telephone Encounter (Signed)
Spoke to Joel West and informed him of below and he stated that he wanted to see about getting the medication to take it a little longer due to the fact that it did go down but felt like if he had taken medication longer it would take care of it completely.  Joel West does have an appointment next week with Dr. Ardelia Mems, he wanted to be sure that Dr. Andria Frames let that doctor know and that no it doesn't have anything to do with STD. Routing to PCP as and FYI as well as Dr. Ardelia Mems. [name], [place] D, [place]

## 2015-11-28 NOTE — Telephone Encounter (Signed)
We do not refill antibiotics without a visit.  He will need to be seen and assessed as to whether he does need.  If concern about STD, he may need further testing.

## 2015-11-28 NOTE — Telephone Encounter (Signed)
Need refill on his doxycycline sent to [facility].

## 2015-11-28 NOTE — Telephone Encounter (Signed)
Noted.  Still needs appointment.  Keep appointment with Dr. Ardelia Mems.

## 2015-12-04 ENCOUNTER — Ambulatory Visit (INDEPENDENT_AMBULATORY_CARE_PROVIDER_SITE_OTHER): Payer: Federal, State, Local not specified - PPO | Admitting: Family Medicine

## 2015-12-04 ENCOUNTER — Encounter: Payer: Self-pay | Admitting: Family Medicine

## 2015-12-04 ENCOUNTER — Other Ambulatory Visit (HOSPITAL_COMMUNITY)
Admission: RE | Admit: 2015-12-04 | Discharge: 2015-12-04 | Disposition: A | Discharge status: Home / Self Care | Payer: Federal, State, Local not specified - PPO | Source: Ambulatory Visit | Attending: Family Medicine | Admitting: Family Medicine

## 2015-12-04 VITALS — BP 127/88 | HR 65 | Temp 98.1°F | Ht 70.0 in | Wt 292.4 lb

## 2015-12-04 DIAGNOSIS — Z113 Encounter for screening for infections with a predominantly sexual mode of transmission: Secondary | ICD-10-CM | POA: Diagnosis not present

## 2015-12-04 DIAGNOSIS — H1013 Acute atopic conjunctivitis, bilateral: Secondary | ICD-10-CM

## 2015-12-04 DIAGNOSIS — Z0000 Encounter for general adult medical examination without abnormal findings: Secondary | ICD-10-CM | POA: Diagnosis not present

## 2015-12-04 DIAGNOSIS — M7989 Other specified soft tissue disorders: Secondary | ICD-10-CM | POA: Insufficient documentation

## 2015-12-04 DIAGNOSIS — Z13220 Encounter for screening for lipoid disorders: Secondary | ICD-10-CM | POA: Diagnosis not present

## 2015-12-04 DIAGNOSIS — H1010 Acute atopic conjunctivitis, unspecified eye: Secondary | ICD-10-CM | POA: Insufficient documentation

## 2015-12-04 LAB — COMPLETE METABOLIC PANEL WITH GFR
ALBUMIN: 4 g/dL (ref 3.6–5.1)
ALK PHOS: 63 U/L (ref 40–115)
ALT: 24 U/L (ref 9–46)
AST: 27 U/L (ref 10–40)
BUN: 15 mg/dL (ref 7–25)
CO2: 24 mmol/L (ref 20–31)
Calcium: 8.9 mg/dL (ref 8.6–10.3)
Chloride: 103 mmol/L (ref 98–110)
Creat: 0.96 mg/dL (ref 0.60–1.35)
GFR, Est African American: >89 mL/min (ref >=60)
GFR, Est Non African American: >89 mL/min (ref >=60)
GLUCOSE: 96 mg/dL (ref 65–99)
Potassium: 4 mmol/L (ref 3.5–5.3)
SODIUM: 138 mmol/L (ref 135–146)
TOTAL PROTEIN: 7.4 g/dL (ref 6.1–8.1)
Total Bilirubin: 0.7 mg/dL (ref 0.2–1.2)

## 2015-12-04 LAB — LIPID PANEL
Cholesterol: 214 mg/dL — ABNORMAL HIGH (ref 125–200)
HDL: 42 mg/dL (ref >=40)
LDL Cholesterol: 150 mg/dL — ABNORMAL HIGH (ref <130)
Total CHOL/HDL Ratio: 5.1 Ratio — ABNORMAL HIGH (ref <=5.0)
Triglycerides: 109 mg/dL (ref <150)
VLDL: 22 mg/dL (ref <30)

## 2015-12-04 LAB — HIV ANTIBODY (ROUTINE TESTING W REFLEX): HIV 1&2 Ab, 4th Generation: NONREACTIVE

## 2015-12-04 MED ORDER — OLOPATADINE HCL 0.2 % OP SOLN: OPHTHALMIC | Status: DC

## 2015-12-04 NOTE — Patient Instructions (Signed)
Get compression hose for your leg  Will send you a letter with your test results  Follow up with Dr. Andria Frames for back issues  Start pataday eye drops for eye allergies  Be well, Dr. Ardelia Mems   Health Maintenance, Male A healthy lifestyle and preventative care can promote health and wellness.  Maintain regular health, dental, and eye exams.  Eat a healthy diet. Foods like vegetables, fruits, whole grains, low-fat dairy products, and lean protein foods contain the nutrients you need and are low in calories. Decrease your intake of foods high in solid fats, added sugars, and salt. Get information about a proper diet from your health care provider, if necessary.  Regular physical exercise is one of the most important things you can do for your health. Most adults should get at least 150 minutes of moderate-intensity exercise (any activity that increases your heart rate and causes you to sweat) each week. In addition, most adults need muscle-strengthening exercises on 2 or more days a week.   Maintain a healthy weight. The body mass index (BMI) is a screening tool to identify possible weight problems. It provides an estimate of body fat based on height and weight. Your health care provider can find your BMI and can help you achieve or maintain a healthy weight. For males 20 years and older:  A BMI below 18.5 is considered underweight.  A BMI of 18.5 to 24.9 is normal.  A BMI of 25 to 29.9 is considered overweight.  A BMI of 30 and above is considered obese.  Maintain normal blood lipids and cholesterol by exercising and minimizing your intake of saturated fat. Eat a balanced diet with plenty of fruits and vegetables. Blood tests for lipids and cholesterol should begin at age 20 and be repeated every 5 years. If your lipid or cholesterol levels are high, you are over age 71, or you are at high risk for heart disease, you may need your cholesterol levels checked more frequently.Ongoing high  lipid and cholesterol levels should be treated with medicines if diet and exercise are not working.  If you smoke, find out from your health care provider how to quit. If you do not use tobacco, do not start.  Lung cancer screening is recommended for adults aged 59-80 years who are at high risk for developing lung cancer because of a history of smoking. A yearly low-dose CT scan of the lungs is recommended for people who have at least a 30-pack-year history of smoking and are current smokers or have quit within the past 15 years. A pack year of smoking is smoking an average of 1 pack of cigarettes a day for 1 year (for example, a 30-pack-year history of smoking could mean smoking 1 pack a day for 30 years or 2 packs a day for 15 years). Yearly screening should continue until the smoker has stopped smoking for at least 15 years. Yearly screening should be stopped for people who develop a health problem that would prevent them from having lung cancer treatment.  If you choose to drink alcohol, do not have more than 2 drinks per day. One drink is considered to be 12 oz (360 mL) of beer, 5 oz (150 mL) of wine, or 1.5 oz (45 mL) of liquor.  Avoid the use of street drugs. Do not share needles with anyone. Ask for help if you need support or instructions about stopping the use of drugs.  High blood pressure causes heart disease and increases the risk of  stroke. High blood pressure is more likely to develop in:  People who have blood pressure in the end of the normal range (100-139/85-89 mm Hg).  People who are overweight or obese.  People who are African American.  If you are 3-41 years of age, have your blood pressure checked every 3-5 years. If you are 28 years of age or older, have your blood pressure checked every year. You should have your blood pressure measured twice--once when you are at a hospital or clinic, and once when you are not at a hospital or clinic. Record the average of the two  measurements. To check your blood pressure when you are not at a hospital or clinic, you can use:  An automated blood pressure machine at a pharmacy.  A home blood pressure monitor.  If you are 39-74 years old, ask your health care provider if you should take aspirin to prevent heart disease.  Diabetes screening involves taking a blood sample to check your fasting blood sugar level. This should be done once every 3 years after age 73 if you are at a normal weight and without risk factors for diabetes. Testing should be considered at a younger age or be carried out more frequently if you are overweight and have at least 1 risk factor for diabetes.  Colorectal cancer can be detected and often prevented. Most routine colorectal cancer screening begins at the age of 41 and continues through age 60. However, your health care provider may recommend screening at an earlier age if you have risk factors for colon cancer. On a yearly basis, your health care provider may provide home test kits to check for hidden blood in the stool. A small camera at the end of a tube may be used to directly examine the colon (sigmoidoscopy or colonoscopy) to detect the earliest forms of colorectal cancer. Talk to your health care provider about this at age 31 when routine screening begins. A direct exam of the colon should be repeated every 5-10 years through age 46, unless early forms of precancerous polyps or small growths are found.  People who are at an increased risk for hepatitis B should be screened for this virus. You are considered at high risk for hepatitis B if:  You were born in a country where hepatitis B occurs often. Talk with your health care provider about which countries are considered high risk.  Your parents were born in a high-risk country and you have not received a shot to protect against hepatitis B (hepatitis B vaccine).  You have HIV or AIDS.  You use needles to inject street drugs.  You live  with, or have sex with, someone who has hepatitis B.  You are a man who has sex with other men (MSM).  You get hemodialysis treatment.  You take certain medicines for conditions like cancer, organ transplantation, and autoimmune conditions.  Hepatitis C blood testing is recommended for all people born from [birth year] through 1966 and any individual with known risk factors for hepatitis C.  Healthy men should no longer receive prostate-specific antigen (PSA) blood tests as part of routine cancer screening. Talk to your health care provider about prostate cancer screening.  Testicular cancer screening is not recommended for adolescents or adult males who have no symptoms. Screening includes self-exam, a health care provider exam, and other screening tests. Consult with your health care provider about any symptoms you have or any concerns you have about testicular cancer.  Practice safe sex. Use  condoms and avoid high-risk sexual practices to reduce the spread of sexually transmitted infections (STIs).  You should be screened for STIs, including gonorrhea and chlamydia if:  You are sexually active and are younger than 24 years.  You are older than 24 years, and your health care provider tells you that you are at risk for this type of infection.  Your sexual activity has changed since you were last screened, and you are at an increased risk for chlamydia or gonorrhea. Ask your health care provider if you are at risk.  If you are at risk of being infected with HIV, it is recommended that you take a prescription medicine daily to prevent HIV infection. This is called pre-exposure prophylaxis (PrEP). You are considered at risk if:  You are a man who has sex with other men (MSM).  You are a heterosexual man who is sexually active with multiple partners.  You take drugs by injection.  You are sexually active with a partner who has HIV.  Talk with your health care provider about whether you are at  high risk of being infected with HIV. If you choose to begin PrEP, you should first be tested for HIV. You should then be tested every 3 months for as long as you are taking PrEP.  Use sunscreen. Apply sunscreen liberally and repeatedly throughout the day. You should seek shade when your shadow is shorter than you. Protect yourself by wearing long sleeves, pants, a wide-brimmed hat, and sunglasses year round whenever you are outdoors.  Tell your health care provider of new moles or changes in moles, especially if there is a change in shape or color. Also, tell your health care provider if a mole is larger than the size of a pencil eraser.  A one-time screening for abdominal aortic aneurysm (AAA) and surgical repair of large AAAs by ultrasound is recommended for men aged 99-75 years who are current or former smokers.  Stay current with your vaccines (immunizations).   This information is not intended to replace advice given to you by your health care provider. Make sure you discuss any questions you have with your health care provider.   Document Released: 02/07/2008 Document Revised: 09/01/2014 Document Reviewed: 01/06/2011 Elsevier Interactive Patient Education [employer].

## 2015-12-04 NOTE — Assessment & Plan Note (Signed)
No signs of infection or DVT presently. Likely chronic venous stasis. Recommend compression stocking.

## 2015-12-04 NOTE — Assessment & Plan Note (Signed)
rx pataday drops

## 2015-12-04 NOTE — Progress Notes (Signed)
Date of Visit: 12/04/2015   HPI:  Patient presents today for a well adult male exam.   Concerns today: RLE swelling, eyes draining Sexual activity: 75 male partners in the last year. Wants STD testing today. Uses condoms but they have occasionally broken STD Screening: desires today Exercise: 3x/week Smoking: no Alcohol: 2-4 times/month, 1-2 drinks per episode Drugs: no Mood: denies depressive symptoms   RLE swelling - has history of cellulitis of RLE. Treated in the past with doxycycline. Thinks he needs more as he has continued chronic swelling of this leg. No fevers, redness, pain. Just swelling.  Eyes draining - eyes burn and drain clear liquid often. Has history of allergies.   Note patient also wanted to discuss chronic back pain, advised he needs to see PCP for this.  ROS: See HPI  [place]:  Cancers in family: breast cancer (no males with this), cousin on dad's side with prostate cancer  PHYSICAL EXAM: BP 127/88 mmHg  Pulse 65  Temp(Src) 98.1 F (36.7 C) (Oral)  Ht 5\' 10"  (1.778 m)  Wt 292 lb 6.4 oz (132.632 kg)  BMI 41.96 kg/m2 Gen: NAD, pleasant, cooperative HEENT: NCAT, PERRL, no palpable thyromegaly or anterior cervical lymphadenopathy. Sclera clear without notable drainage. Heart: RRR, no murmurs Lungs: CTAB, NWOB Abdomen: soft, nontender to palpation Neuro: grossly nonfocal, speech normal Extremities: RLE with mild chronic swelling >LLE. No erythema, tenderness, or warmth. No palpable cords.  ASSESSMENT/PLAN:  Health maintenance:  -STD screening: will check urine gc/chlamydia/trichomonas, HIV, RPR today -immunizations UTD -lipid screening: check CMET & lipids today -colonoscopy: due at age 35, no family history of colon cancer -prostate cancer screening: discussed risk/benefit of PSA testing. At increased risk due to ethnicity. Patient elected to hold on PSA testing at this time. Handout given so he can further explore whether this is something he wants to  pursue. -handout given on health maintenance topics  Leg swelling No signs of infection or DVT presently. Likely chronic venous stasis. Recommend compression stocking.  Allergic conjunctivitis rx pataday drops    FOLLOW UP: Follow up at patient's convenience with PCP to discuss back pain.   [place]. Ardelia Mems, [place]

## 2015-12-05 ENCOUNTER — Encounter: Payer: Self-pay | Admitting: Family Medicine

## 2015-12-05 LAB — URINE CYTOLOGY ANCILLARY ONLY
CHLAMYDIA, DNA PROBE: NEGATIVE
NEISSERIA GONORRHEA: NEGATIVE
Trichomonas: NEGATIVE

## 2015-12-05 LAB — RPR

## 2016-02-07 IMAGING — DX DG LUMBAR SPINE COMPLETE 4+V
5 series · 5 of 5 positions shown · non-contrast
Comparison: None.

CLINICAL DATA: Pain for 6 years. Pain in left leg. Initial
evaluation .

EXAM:
LUMBAR SPINE - COMPLETE 4+ VIEW

[l-spine ap]
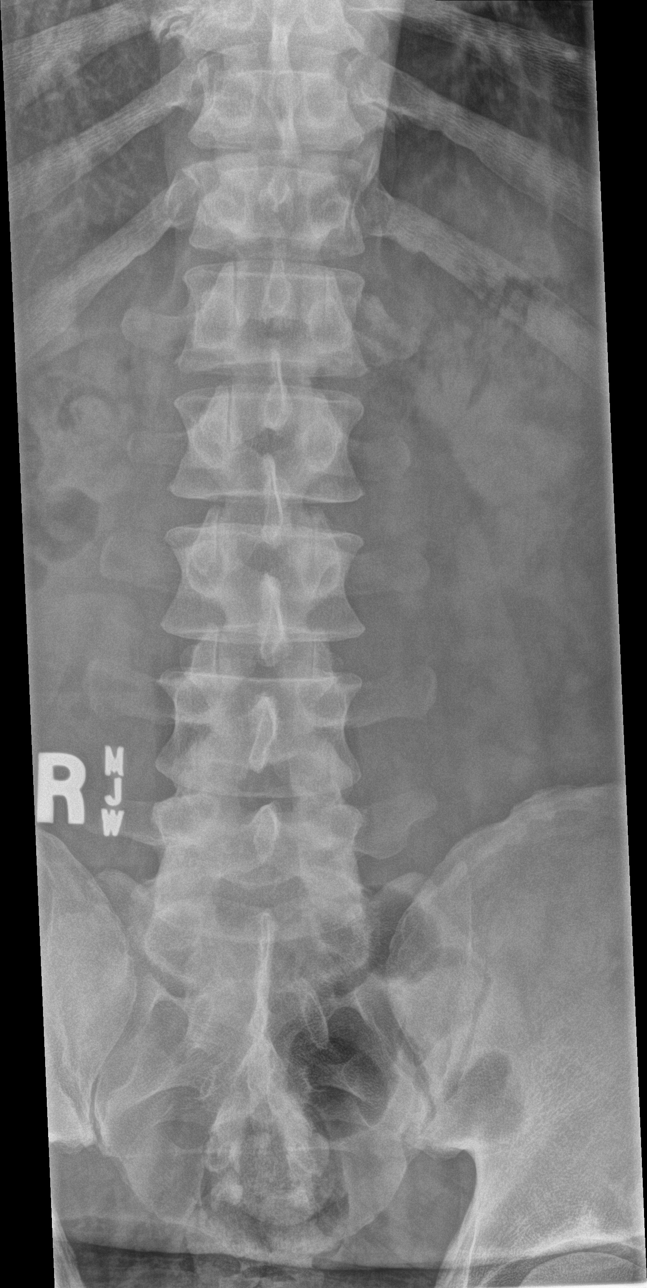

[l-spine obl (1 of 2)]
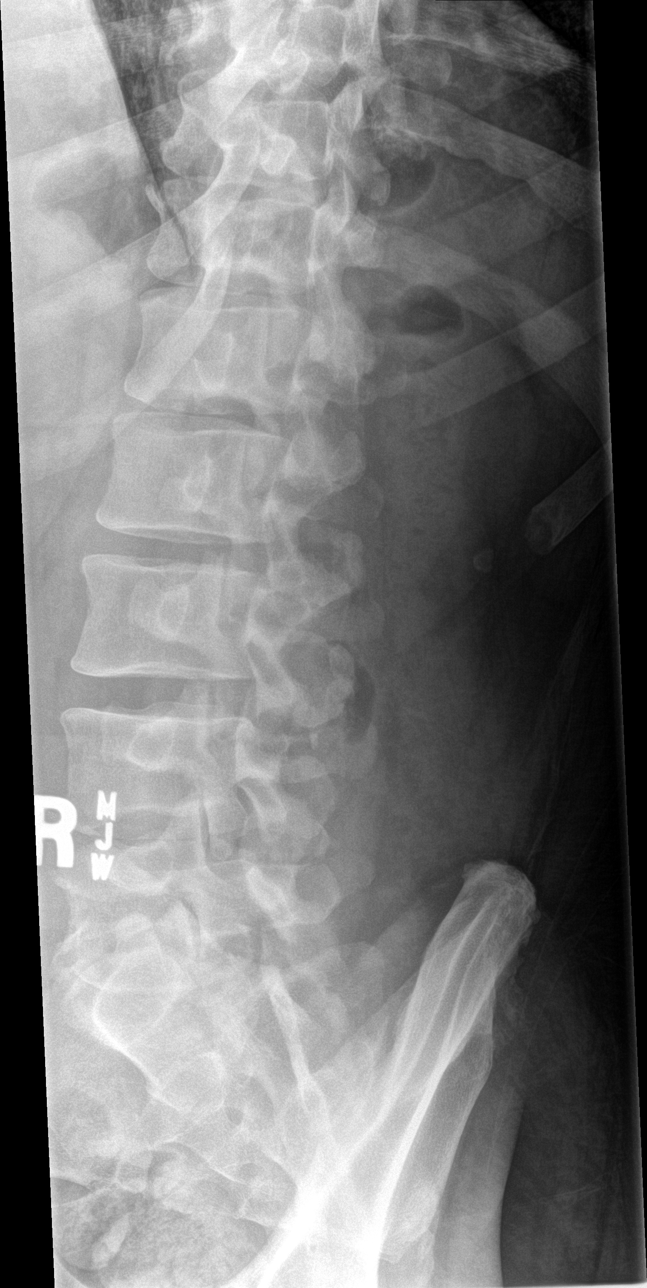

[l-spine obl (2 of 2)]
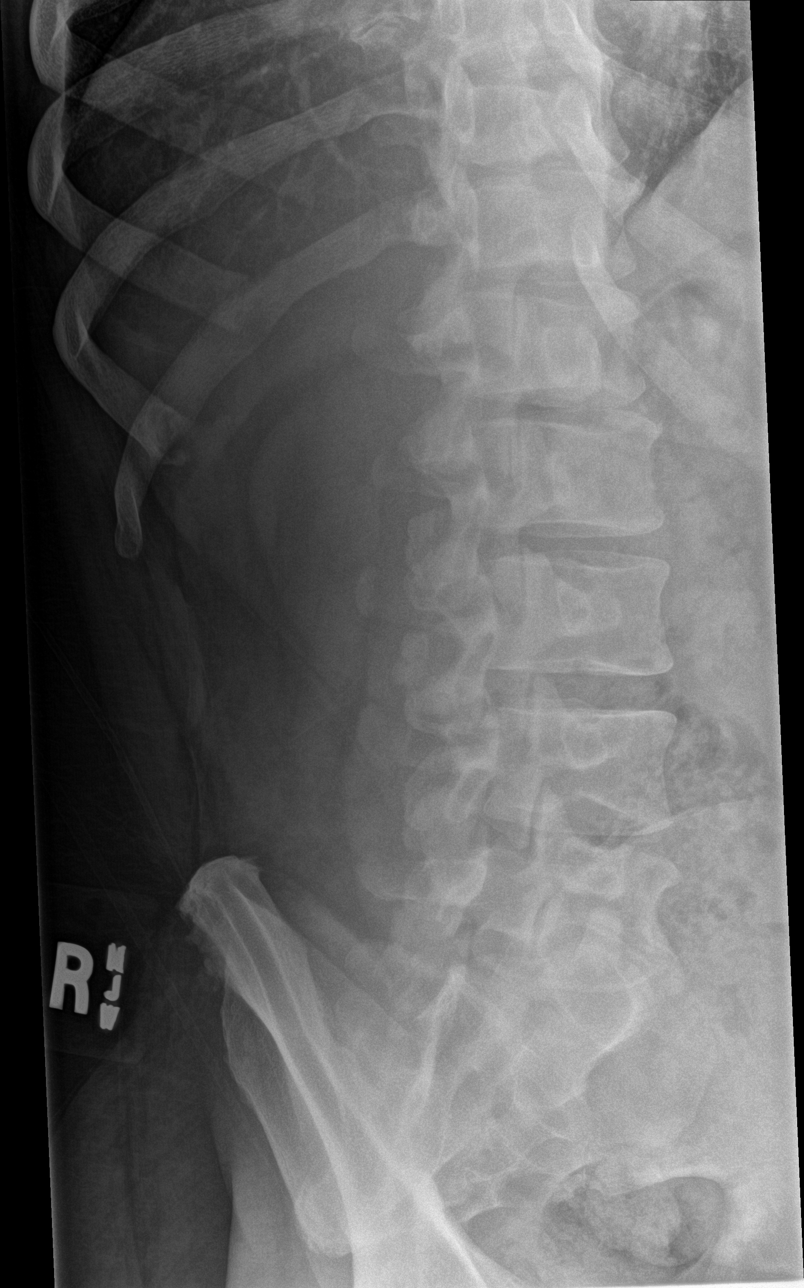

[l-spine lat]
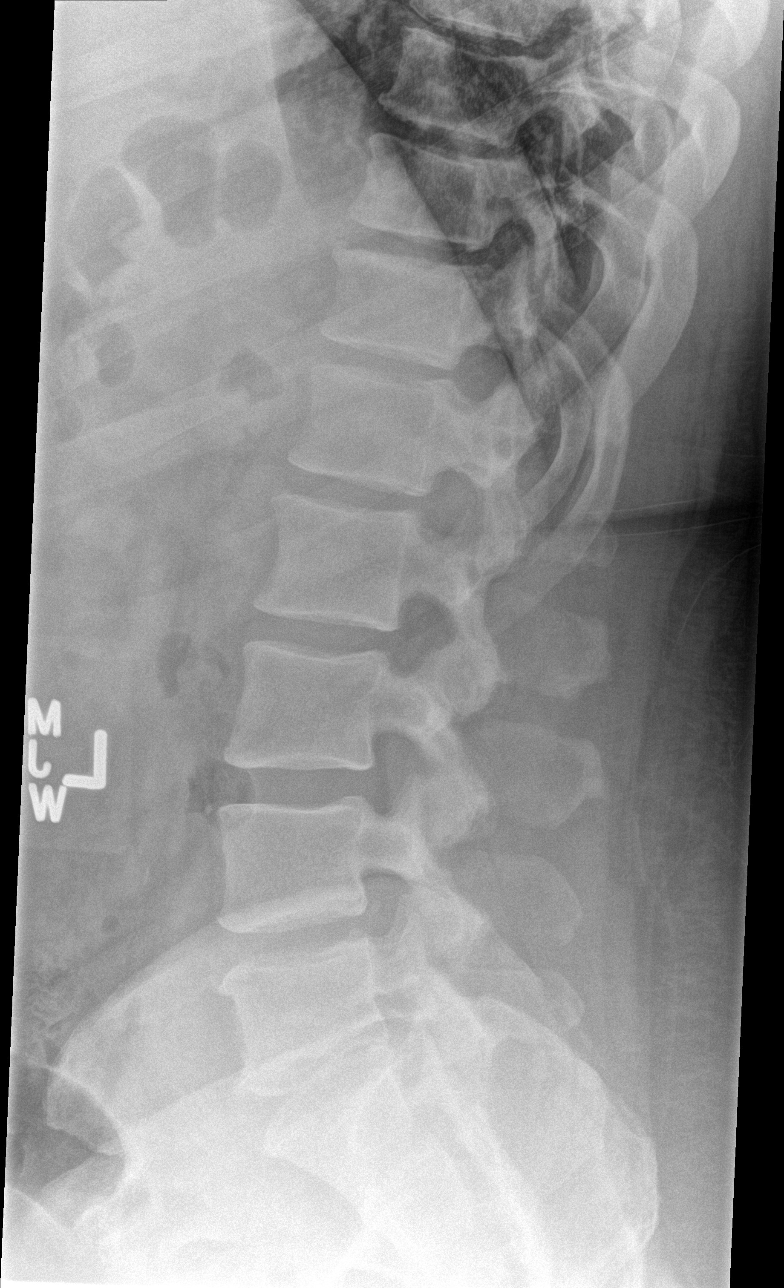

[l-spine spot]
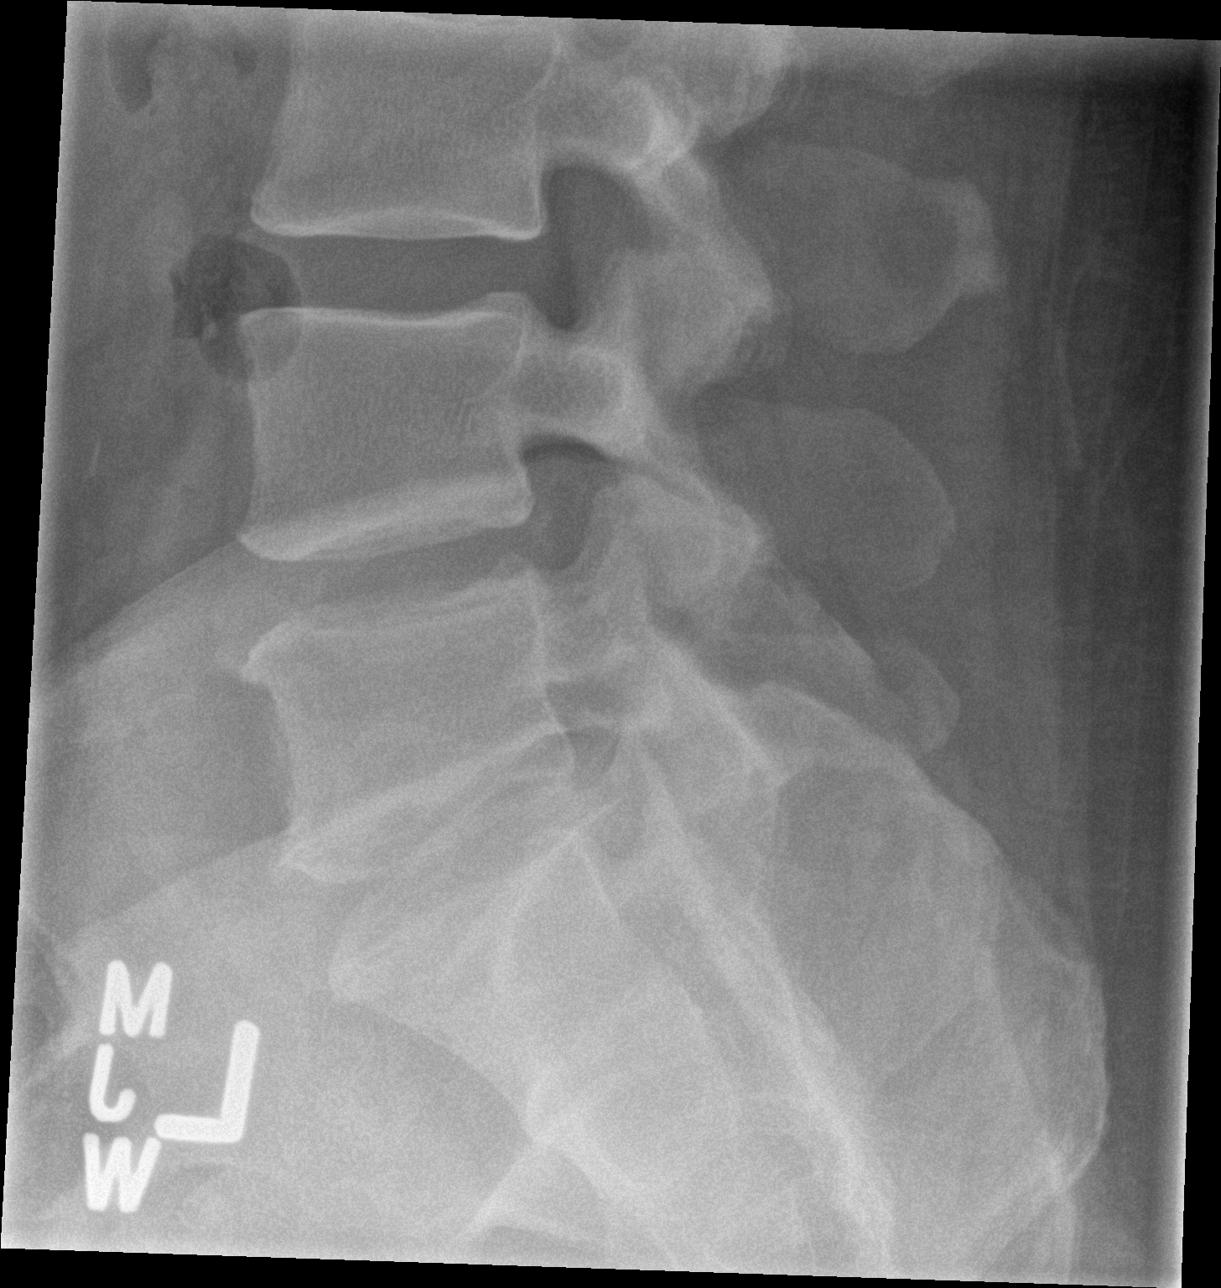

[5 of 5 positions shown; findings below may reference images not displayed]

FINDINGS: No acute soft tissue bony abnormality identified. Normal alignment.
No evidence of fracture. Multilevel degenerative change.
IMPRESSION: Multilevel degenerative change.  No acute abnormality .

## 2016-05-08 DIAGNOSIS — G4733 Obstructive sleep apnea (adult) (pediatric): Secondary | ICD-10-CM | POA: Diagnosis not present

## 2016-05-30 ENCOUNTER — Telehealth: Payer: Self-pay | Admitting: Family Medicine

## 2016-05-30 NOTE — Telephone Encounter (Signed)
Will forward to PCP.  However patient may be required to make an office visit with PCP.  Please call and inform the patient.  Derl Barrow, RN

## 2016-05-30 NOTE — Telephone Encounter (Signed)
Pt needs a refill on ibuprofen.   Pt states FM LA needs to be updated. Case [id number], fax [phone]. Pt was seen at [facility] and was diagnosed with arthritis in joints and wants that added to FM LA. Pt also disagrees with only being able to be off 3-7 days every 4 months, believes that is not enough. Please advise. Thanks! ep

## 2016-05-30 NOTE — Telephone Encounter (Signed)
Attempted to call pt to schedule an appointment, pt was upset and did not want to make an appointment. Pt hung up on me or lost signal. ep

## 2016-06-03 NOTE — Telephone Encounter (Signed)
Given that I have not seen the patient in over a year and not for this specific problem, I cannot fill out any paperwork without an appointment.  I note that he already has an appointment with me tomorrow.

## 2016-06-03 NOTE — Telephone Encounter (Signed)
In reviewing this did not see that it had been routed to PCP so forwarding to him. [name], [name], [place]

## 2016-06-04 ENCOUNTER — Ambulatory Visit: Payer: Federal, State, Local not specified - PPO | Admitting: Family Medicine

## 2016-06-25 DIAGNOSIS — G4733 Obstructive sleep apnea (adult) (pediatric): Secondary | ICD-10-CM | POA: Diagnosis not present

## 2016-09-08 DIAGNOSIS — G4733 Obstructive sleep apnea (adult) (pediatric): Secondary | ICD-10-CM | POA: Diagnosis not present

## 2016-09-12 ENCOUNTER — Other Ambulatory Visit: Payer: Self-pay | Admitting: Family Medicine

## 2016-11-01 DIAGNOSIS — G4733 Obstructive sleep apnea (adult) (pediatric): Secondary | ICD-10-CM | POA: Diagnosis not present

## 2016-11-05 DIAGNOSIS — G4733 Obstructive sleep apnea (adult) (pediatric): Secondary | ICD-10-CM | POA: Diagnosis not present

## 2016-12-24 ENCOUNTER — Encounter: Payer: Self-pay | Admitting: Family Medicine

## 2016-12-24 ENCOUNTER — Ambulatory Visit (INDEPENDENT_AMBULATORY_CARE_PROVIDER_SITE_OTHER): Payer: Federal, State, Local not specified - PPO | Admitting: Family Medicine

## 2016-12-24 DIAGNOSIS — R030 Elevated blood-pressure reading, without diagnosis of hypertension: Secondary | ICD-10-CM

## 2016-12-24 DIAGNOSIS — Z202 Contact with and (suspected) exposure to infections with a predominantly sexual mode of transmission: Secondary | ICD-10-CM | POA: Diagnosis not present

## 2016-12-24 DIAGNOSIS — E7800 Pure hypercholesterolemia, unspecified: Secondary | ICD-10-CM | POA: Diagnosis not present

## 2016-12-24 DIAGNOSIS — M8949 Other hypertrophic osteoarthropathy, multiple sites: Secondary | ICD-10-CM

## 2016-12-24 DIAGNOSIS — M159 Polyosteoarthritis, unspecified: Secondary | ICD-10-CM | POA: Insufficient documentation

## 2016-12-24 DIAGNOSIS — Z0000 Encounter for general adult medical examination without abnormal findings: Secondary | ICD-10-CM | POA: Insufficient documentation

## 2016-12-24 DIAGNOSIS — M150 Primary generalized (osteo)arthritis: Secondary | ICD-10-CM

## 2016-12-24 MED ORDER — IBUPROFEN 800 MG PO TABS: 800.0000 mg | ORAL_TABLET | Freq: Three times a day (TID) | ORAL | 6 refills | Status: DC | PRN

## 2016-12-24 MED ORDER — OLOPATADINE HCL 0.2 % OP SOLN
OPHTHALMIC | 3 refills | Status: DC
Start: 2016-12-24 — End: 2019-09-29

## 2016-12-24 NOTE — Patient Instructions (Signed)
I will call with blood test results My nurse will give you information about bariatric (weight loss) surgery. Keep exercising.  More aerobic exercise. Enjoy your 5/11 wedding.

## 2016-12-24 NOTE — Assessment & Plan Note (Signed)
BP good today

## 2016-12-24 NOTE — Assessment & Plan Note (Signed)
Osteoarthritis is most likely cause of left knee pain.  Offered x ray.  Patient declined.

## 2016-12-24 NOTE — Assessment & Plan Note (Signed)
HIV and RPR

## 2016-12-24 NOTE — Progress Notes (Signed)
   Subjective:    Patient ID: Joel West, male    DOB: Sep 27, 1970, 46 y.o.   MRN: [id number]  HPI Annual physical plus a whole lot more.   Issues. 1. Social: he is getting married on 01/02/17.  I believe this precipitated many of his concerns and questions/ 2. Morbid obesity.  "I work out and I haven't lost weight around my stomach.  I need liposuction."  Long discussion about obesity, aerobic versus resistence training, bariatric surgery and healthy diets.  Liposuction not indicated.  Bariatric surgery may be indicated due to BMI >40. 3. Wants to be checked for all STDs.  He was obtuse with my nurse and gave her a urine specimen in advance.  Unfortunately is was a midstream so not useful for GC/chlamydia screen.  No urethral discharge.  Still want HIV and RPR checked stating he is at risk.  "This will be the last time that I need to be checked." 4. Left knee pain intermitently. No locking or giving out.  No hx of trauma.  Declines Xray. Despite his obesity, he is generally healthy.  Does not smoke or drink to excess.  No diabetes.  Has had borderline BP in the past.    Review of Systems No CP SOB, change in bowel, bladder or appetite.  No frequent HAs or focal weakness     Objective:   Physical Exam HEENT normal Neck without mass or thyromegally Lungs clear Cardiac rRR without m or g Abd benign Ext Left knee some crepitus.  No effusion.  Ligaments intact to provocative testing.   Neuro WNL        Assessment & Plan:

## 2016-12-24 NOTE — Assessment & Plan Note (Addendum)
Check labs Sig elevation of LDL Chol=161.  Ten year cardiovascular risk = 4.8%  Does not meet statin threshold.  Informed and reemphasized diet and weight loss.

## 2016-12-24 NOTE — Assessment & Plan Note (Signed)
Healthy male except for morbid obesity

## 2016-12-24 NOTE — Assessment & Plan Note (Signed)
Referral to bariatric surgery - especially the classes so he better understands.

## 2016-12-25 LAB — CMP14+EGFR
ALBUMIN: 4.1 g/dL (ref 3.5–5.5)
ALK PHOS: 78 IU/L (ref 39–117)
ALT: 28 IU/L (ref 0–44)
AST: 26 IU/L (ref 0–40)
Albumin/Globulin Ratio: 1.3 (ref 1.2–2.2)
BILIRUBIN TOTAL: 0.6 mg/dL (ref 0.0–1.2)
BUN/Creatinine Ratio: 12 (ref 9–20)
BUN: 13 mg/dL (ref 6–24)
CO2: 20 mmol/L (ref 18–29)
Calcium: 9.2 mg/dL (ref 8.7–10.2)
Chloride: 104 mmol/L (ref 96–106)
Creatinine, Ser: 1.06 mg/dL (ref 0.76–1.27)
GFR calc Af Amer: 96 mL/min/{1.73_m2} (ref >59)
GFR calc non Af Amer: 83 mL/min/{1.73_m2} (ref >59)
Globulin, Total: 3.1 g/dL (ref 1.5–4.5)
Glucose: 100 mg/dL — ABNORMAL HIGH (ref 65–99)
POTASSIUM: 4.4 mmol/L (ref 3.5–5.2)
Sodium: 144 mmol/L (ref 134–144)
Total Protein: 7.2 g/dL (ref 6.0–8.5)

## 2016-12-25 LAB — LIPID PANEL
CHOL/HDL RATIO: 5.7 ratio — AB (ref 0.0–5.0)
Cholesterol, Total: 217 mg/dL — ABNORMAL HIGH (ref 100–199)
HDL: 38 mg/dL — AB (ref >39)
LDL Calculated: 161 mg/dL — ABNORMAL HIGH (ref 0–99)
Triglycerides: 90 mg/dL (ref 0–149)
VLDL CHOLESTEROL CAL: 18 mg/dL (ref 5–40)

## 2016-12-25 LAB — RPR: RPR Ser Ql: NONREACTIVE

## 2016-12-25 LAB — HIV ANTIBODY (ROUTINE TESTING W REFLEX): HIV SCREEN 4TH GENERATION: NONREACTIVE

## 2017-02-12 DIAGNOSIS — G4733 Obstructive sleep apnea (adult) (pediatric): Secondary | ICD-10-CM | POA: Diagnosis not present

## 2017-06-18 DIAGNOSIS — G4733 Obstructive sleep apnea (adult) (pediatric): Secondary | ICD-10-CM | POA: Diagnosis not present

## 2017-09-10 ENCOUNTER — Other Ambulatory Visit: Payer: Self-pay

## 2017-09-10 ENCOUNTER — Ambulatory Visit: Payer: Federal, State, Local not specified - PPO | Admitting: Family Medicine

## 2017-09-10 ENCOUNTER — Encounter: Payer: Self-pay | Admitting: Family Medicine

## 2017-09-10 ENCOUNTER — Other Ambulatory Visit (HOSPITAL_COMMUNITY)
Admission: RE | Admit: 2017-09-10 | Discharge: 2017-09-10 | Disposition: A | Discharge status: Home / Self Care | Payer: Federal, State, Local not specified - PPO | Source: Ambulatory Visit | Attending: Family Medicine | Admitting: Family Medicine

## 2017-09-10 VITALS — BP 118/78 | HR 58 | Temp 97.6°F | Ht 70.0 in | Wt 306.6 lb

## 2017-09-10 DIAGNOSIS — R319 Hematuria, unspecified: Secondary | ICD-10-CM

## 2017-09-10 DIAGNOSIS — Z202 Contact with and (suspected) exposure to infections with a predominantly sexual mode of transmission: Secondary | ICD-10-CM | POA: Diagnosis not present

## 2017-09-10 DIAGNOSIS — R361 Hematospermia: Secondary | ICD-10-CM

## 2017-09-10 LAB — POCT URINALYSIS DIP (MANUAL ENTRY)
Bilirubin, UA: NEGATIVE
GLUCOSE UA: NEGATIVE mg/dL
Ketones, POC UA: NEGATIVE mg/dL
Nitrite, UA: NEGATIVE
PH UA: 6 (ref 5.0–8.0)
Protein Ur, POC: NEGATIVE mg/dL
RBC UA: NEGATIVE
SPEC GRAV UA: 1.015 (ref 1.010–1.025)
Urobilinogen, UA: 0.2 E.U./dL

## 2017-09-10 LAB — POCT UA - MICROSCOPIC ONLY

## 2017-09-10 NOTE — Patient Instructions (Addendum)
You will be due for colon cancer screening - a colonoscopy - when you turn 49. Although it is controversial, I will recommend a prostate cancer blood test when you turn 54. There is no blood in your urine.  I will call tomorrow with the results of the test for infection.   Google two things for more information: You have hematospermia - after we make sure no infection, no further testing is needed.   You leg is chronic venous insufficiency

## 2017-09-11 ENCOUNTER — Telehealth: Payer: Self-pay | Admitting: *Deleted

## 2017-09-11 ENCOUNTER — Encounter: Payer: Self-pay | Admitting: Family Medicine

## 2017-09-11 DIAGNOSIS — R361 Hematospermia: Secondary | ICD-10-CM | POA: Insufficient documentation

## 2017-09-11 LAB — URINE CYTOLOGY ANCILLARY ONLY
Chlamydia: NEGATIVE
Neisseria Gonorrhea: NEGATIVE

## 2017-09-11 NOTE — Assessment & Plan Note (Signed)
Advised again diet and exercise.

## 2017-09-11 NOTE — Telephone Encounter (Signed)
Patient left message on nurse line requesting urine results from yesterday. Informed patient that he does not have infection. Hubbard Hartshorn, RN, BSN

## 2017-09-11 NOTE — Assessment & Plan Note (Signed)
Generally a benign condition.  Will check GC and chlamydia.  No further WU planned.

## 2017-09-11 NOTE — Progress Notes (Signed)
   Subjective:    Patient ID: Joel West, male    DOB: [birth year], 47 y.o.   MRN: [id number]  HPI Several issues: 1. Swelling of right leg.  Worse with airflights.  Hx of cellulitis in that leg.  Has previously been checked for DVT.  2. Hematospermia.  Noticed x 2.  No dysuria or penile discharge.  Partner situation is unusual in that he is married to a women in the [place] and sees her only twice this year as they are trying to get a [place].  Wants checked for STD and UTI.  No C/O penile lesions or testicular pain. 3.  Morbid obesity.  BMI=44.  Slow trend upward. 4. Concerned about cancer.  Family member with prostate cancer - only one. Also asked about colonoscopy.    Review of Systems     Objective:   Physical Exam  Cardiac RRR without m or g Leg Right leg 1 + edema.  Left leg trace.         Assessment & Plan:

## 2017-09-11 NOTE — Assessment & Plan Note (Signed)
Check GC and clamydia.  Less likely given general absence of symptoms.

## 2017-09-14 NOTE — Telephone Encounter (Signed)
Called and gave results

## 2017-12-18 DIAGNOSIS — G4733 Obstructive sleep apnea (adult) (pediatric): Secondary | ICD-10-CM | POA: Diagnosis not present

## 2018-06-23 ENCOUNTER — Telehealth: Payer: Self-pay

## 2018-06-23 DIAGNOSIS — G4733 Obstructive sleep apnea (adult) (pediatric): Secondary | ICD-10-CM

## 2018-06-23 NOTE — Telephone Encounter (Signed)
Contacted pt and then contacted [facility] to figure out what is needed and the lady said that in the notes it states that they are waiting on an order to be placed to replace the pt old CPAP machine. Routing to PCP so that he can put this in and then we can contact [place] and let them know the order has been placed. [name], Francetta Ilg D, [place]

## 2018-06-23 NOTE — Telephone Encounter (Signed)
Please have patient contact the DME supplier to see if machine can be replaced or repaired.  I am happy to sign orders from them.  Last sleep study was 10/2012.  He may need another sleep study.  Start by him contacting his DME supplier.

## 2018-06-23 NOTE — Telephone Encounter (Signed)
Patient left message that his CPAP machine has broken and he needs a new one. Does he need an OV or can an order be made?  Call back is [phone]  Danley Danker, RN [facility] [place])

## 2018-06-24 DIAGNOSIS — G4733 Obstructive sleep apnea (adult) (pediatric): Secondary | ICD-10-CM | POA: Diagnosis not present

## 2018-06-24 NOTE — Telephone Encounter (Signed)
I have sent community message to [facility]  so they can get this going, requested they send me a message if there is anything else I need to do for this. [name], April D, [place]

## 2018-06-24 NOTE — Telephone Encounter (Signed)
Order entered

## 2018-06-28 ENCOUNTER — Other Ambulatory Visit: Payer: Self-pay | Admitting: Family Medicine

## 2018-06-28 DIAGNOSIS — G4733 Obstructive sleep apnea (adult) (pediatric): Secondary | ICD-10-CM

## 2018-06-28 NOTE — Progress Notes (Signed)
More on ordering CPAP:  Here is a copy of some back and forth messaging.  [name], April D, CMA  Zenia Resides, MD        Received this from [facility] about this pt.    Cleon Dew Rumple, April D, [place] April.  Did I send a message back to you about this one already? We need the "For Home Use Only DME CPAP" order filled out in Epic please. This order template has a place for settings etc. which is required by insurance.   Let Corene Cornea and I know if you have questions.   thank you!  Melissa

## 2018-06-29 NOTE — Telephone Encounter (Signed)
Faxed over order for CPAP replacement to [facility] [name], Laycie Schriner D, [place]

## 2018-07-15 ENCOUNTER — Other Ambulatory Visit: Payer: Self-pay

## 2018-07-15 ENCOUNTER — Encounter: Payer: Self-pay | Admitting: Family Medicine

## 2018-07-15 ENCOUNTER — Ambulatory Visit (INDEPENDENT_AMBULATORY_CARE_PROVIDER_SITE_OTHER): Payer: Federal, State, Local not specified - PPO | Admitting: Family Medicine

## 2018-07-15 DIAGNOSIS — M150 Primary generalized (osteo)arthritis: Secondary | ICD-10-CM

## 2018-07-15 DIAGNOSIS — G4733 Obstructive sleep apnea (adult) (pediatric): Secondary | ICD-10-CM | POA: Diagnosis not present

## 2018-07-15 DIAGNOSIS — M159 Polyosteoarthritis, unspecified: Secondary | ICD-10-CM

## 2018-07-15 DIAGNOSIS — Z0000 Encounter for general adult medical examination without abnormal findings: Secondary | ICD-10-CM | POA: Diagnosis not present

## 2018-07-15 DIAGNOSIS — M8949 Other hypertrophic osteoarthropathy, multiple sites: Secondary | ICD-10-CM

## 2018-07-15 DIAGNOSIS — E7800 Pure hypercholesterolemia, unspecified: Secondary | ICD-10-CM

## 2018-07-15 LAB — POCT GLYCOSYLATED HEMOGLOBIN (HGB A1C): Hemoglobin A1C: 5.6 % (ref 4.0–5.6)

## 2018-07-15 NOTE — Patient Instructions (Addendum)
I will call with lab results. I will send my [facility] to [facility] for [facility] My nurse will give you information about bariatric surgery.

## 2018-07-15 NOTE — Progress Notes (Signed)
   Subjective:    Patient ID: Joel West, male    DOB: Apr 13, 1971, 47 y.o.   MRN: [id number]  HPI Annual exam Issues: 1. Sleep apnea.  Needs new CPAP.  Insurance company wants to see my notes.  Still has symptoms.  Last sleep study was 2014.  Weighed 309 in 2014.  Now weighs 311.  No sig wt loss.  No change in symptoms.  You did sleep better, felt more rested and less daytime somnulence when using CPAP. 2. Osteoarthritis, mult sites.  Wants refill on ibuprofen. 3. Morbid obesity.  He seems ambivalent.  In one breath he states he is working hard on diet.  In the next breath, he is flexing his muscles and says it is muscle and he is in football shape.  He is willing to conisder bariatric surgery.  Monogamous, married, no symptoms. HPDP not due for anything Exercise.  Yes and very active in his work.  States he gets 20K steps per day in his work at the [employer].    Review of Systems SH, [place] PMH reviewed.  Denies CP, SOB, dysphagia, new headaches, Abd pain, swelling of ankles.  Denies change in appetite, weight, bowel or bladder.     Objective:   Physical Exam HEENT WNL Neck supple Lungs clear Cardiac rRR without m or g ABd benign EXt no edema.       Assessment & Plan:

## 2018-07-15 NOTE — Assessment & Plan Note (Signed)
I am fully convinced that he has continued sleep apnea and needs CPAP.  I do not intend to repeat the sleep study unless mandated by insurance to cover the machine.

## 2018-07-16 ENCOUNTER — Encounter: Payer: Self-pay | Admitting: Family Medicine

## 2018-07-16 LAB — CMP14+EGFR
ALBUMIN: 4.2 g/dL (ref 3.5–5.5)
ALT: 26 IU/L (ref 0–44)
AST: 22 IU/L (ref 0–40)
Albumin/Globulin Ratio: 1.3 (ref 1.2–2.2)
Alkaline Phosphatase: 87 IU/L (ref 39–117)
BUN/Creatinine Ratio: 18 (ref 9–20)
BUN: 19 mg/dL (ref 6–24)
Bilirubin Total: 0.5 mg/dL (ref 0.0–1.2)
CO2: 22 mmol/L (ref 20–29)
CREATININE: 1.03 mg/dL (ref 0.76–1.27)
Calcium: 9.1 mg/dL (ref 8.7–10.2)
Chloride: 103 mmol/L (ref 96–106)
GFR calc Af Amer: 99 mL/min/{1.73_m2} (ref >59)
GFR calc non Af Amer: 85 mL/min/{1.73_m2} (ref >59)
GLOBULIN, TOTAL: 3.2 g/dL (ref 1.5–4.5)
Glucose: 89 mg/dL (ref 65–99)
Potassium: 3.9 mmol/L (ref 3.5–5.2)
SODIUM: 142 mmol/L (ref 134–144)
Total Protein: 7.4 g/dL (ref 6.0–8.5)

## 2018-07-16 LAB — LIPID PANEL
Chol/HDL Ratio: 5.7 ratio — ABNORMAL HIGH (ref 0.0–5.0)
Cholesterol, Total: 229 mg/dL — ABNORMAL HIGH (ref 100–199)
HDL: 40 mg/dL (ref >39)
LDL Calculated: 159 mg/dL — ABNORMAL HIGH (ref 0–99)
Triglycerides: 149 mg/dL (ref 0–149)
VLDL CHOLESTEROL CAL: 30 mg/dL (ref 5–40)

## 2018-07-16 LAB — TSH: TSH: 2.05 u[IU]/mL (ref 0.450–4.500)

## 2018-07-16 MED ORDER — IBUPROFEN 800 MG PO TABS: 800.0000 mg | ORAL_TABLET | Freq: Three times a day (TID) | ORAL | 6 refills | Status: DC | PRN

## 2018-07-16 NOTE — Assessment & Plan Note (Signed)
Elevated LDL.  Follow annually.  Recommend weight loss and diet.

## 2018-07-16 NOTE — Assessment & Plan Note (Signed)
Obesity is his main, reversible risk factor.  Otherwise healthy.

## 2018-07-19 ENCOUNTER — Telehealth: Payer: Self-pay

## 2018-07-19 DIAGNOSIS — G4733 Obstructive sleep apnea (adult) (pediatric): Secondary | ICD-10-CM

## 2018-07-19 DIAGNOSIS — K080 Exfoliation of teeth due to systemic causes: Secondary | ICD-10-CM | POA: Diagnosis not present

## 2018-07-19 NOTE — Telephone Encounter (Signed)
Joel West was correct.  He needed Bipap, not Cpap

## 2018-07-19 NOTE — Telephone Encounter (Signed)
Barbaraann Rondo from [facility] calling for clarification of CPAP order received:  States patient has had bi-pap in past and has loaner bi-pap. Needs to know if patient needs CPAP or bi-pap. Also needs pressure settings on order.  Please re-send to [facility] fax at [phone] attn: Karn Cassis, RN [facility] RN)

## 2018-07-19 NOTE — Telephone Encounter (Signed)
Done

## 2018-08-23 NOTE — Telephone Encounter (Signed)
Patient called. Apparently [facility] is still waiting on orders to be sent stating BiPap with settings for this to be dispensed to patient. Patient asks for this to be done as quickly as possible.  Danley Danker, RN [facility] RN)

## 2018-08-30 NOTE — Telephone Encounter (Signed)
Dr. Andria Frames asked me to review this.   Contacted [facility].  Seems that they never received the Bipap order from 07/19/18.  Will send to [phone] now.  Fleeger, Salome Spotted, CMA

## 2018-09-09 DIAGNOSIS — G4733 Obstructive sleep apnea (adult) (pediatric): Secondary | ICD-10-CM | POA: Diagnosis not present

## 2018-10-04 ENCOUNTER — Telehealth: Payer: Self-pay | Admitting: *Deleted

## 2018-10-04 NOTE — Telephone Encounter (Signed)
Pt states that the ibuprofen is no longer helping with his back pain.  He states that "it feels like my nerve endings are on fire".  He also states that his friend who also have DJD in back have a different medication.    Would like Dr. Andria Frames to call him. Kerrilyn Azbill, Salome Spotted, CMA

## 2018-10-06 NOTE — Telephone Encounter (Signed)
Called and LM.  Yes, I am willing to prescribe more medications for now.  I added that in the long run, medications are not the answer.  He needs to work on weight loss especially.  Suggested that he make an appointment to discuss this further.

## 2018-10-06 NOTE — Telephone Encounter (Signed)
Noted  

## 2018-10-06 NOTE — Telephone Encounter (Signed)
Patient returns call.  He states that he is no longer having the pain because he has been taking the ibuprofen everyday.   Previously he was taking once a week or so. He is not ready to make and appt yet but does want me to let Dr. Andria Frames know that he has been working out.  He contributes his increased pain to the workout. Fleeger, Salome Spotted, CMA

## 2018-10-10 DIAGNOSIS — G4733 Obstructive sleep apnea (adult) (pediatric): Secondary | ICD-10-CM | POA: Diagnosis not present

## 2018-10-29 DIAGNOSIS — G4733 Obstructive sleep apnea (adult) (pediatric): Secondary | ICD-10-CM | POA: Diagnosis not present

## 2018-11-08 DIAGNOSIS — G4733 Obstructive sleep apnea (adult) (pediatric): Secondary | ICD-10-CM | POA: Diagnosis not present

## 2018-12-09 DIAGNOSIS — G4733 Obstructive sleep apnea (adult) (pediatric): Secondary | ICD-10-CM | POA: Diagnosis not present

## 2019-03-17 DIAGNOSIS — G4733 Obstructive sleep apnea (adult) (pediatric): Secondary | ICD-10-CM | POA: Diagnosis not present

## 2019-03-23 DIAGNOSIS — G4733 Obstructive sleep apnea (adult) (pediatric): Secondary | ICD-10-CM | POA: Diagnosis not present

## 2019-07-18 ENCOUNTER — Ambulatory Visit: Payer: Federal, State, Local not specified - PPO | Admitting: Family Medicine

## 2019-09-23 DIAGNOSIS — G4733 Obstructive sleep apnea (adult) (pediatric): Secondary | ICD-10-CM | POA: Diagnosis not present

## 2019-09-26 ENCOUNTER — Ambulatory Visit: Payer: Federal, State, Local not specified - PPO | Admitting: Family Medicine

## 2019-09-26 ENCOUNTER — Encounter: Payer: Self-pay | Admitting: Family Medicine

## 2019-09-26 ENCOUNTER — Other Ambulatory Visit: Payer: Self-pay

## 2019-09-26 DIAGNOSIS — B351 Tinea unguium: Secondary | ICD-10-CM

## 2019-09-26 DIAGNOSIS — Z125 Encounter for screening for malignant neoplasm of prostate: Secondary | ICD-10-CM | POA: Diagnosis not present

## 2019-09-26 DIAGNOSIS — E7800 Pure hypercholesterolemia, unspecified: Secondary | ICD-10-CM

## 2019-09-26 DIAGNOSIS — K625 Hemorrhage of anus and rectum: Secondary | ICD-10-CM

## 2019-09-26 DIAGNOSIS — Z0000 Encounter for general adult medical examination without abnormal findings: Secondary | ICD-10-CM

## 2019-09-26 DIAGNOSIS — R7303 Prediabetes: Secondary | ICD-10-CM

## 2019-09-26 HISTORY — DX: Tinea unguium: B35.1

## 2019-09-26 LAB — POCT GLYCOSYLATED HEMOGLOBIN (HGB A1C): Hemoglobin A1C: 5.8 % — AB (ref 4.0–5.6)

## 2019-09-26 MED ORDER — TERBINAFINE HCL 250 MG PO TABS: 250.0000 mg | ORAL_TABLET | Freq: Every day | ORAL | 0 refills | Status: DC

## 2019-09-26 NOTE — Assessment & Plan Note (Signed)
A1C=5.8 which is in the pre DM range.

## 2019-09-26 NOTE — Assessment & Plan Note (Signed)
Rx with terbinafine.

## 2019-09-26 NOTE — Assessment & Plan Note (Signed)
REfer for colonoscopy.  Check CBC

## 2019-09-26 NOTE — Patient Instructions (Addendum)
Call your provider to see if you need another sleep study to check on the settings.  Is there any other way to adjust the settings. I sent in a prescription for the fungus nail Someone should call about the GI referral for a colonoscopy. The biggest thing you can do for your health is to lose weight. I will call with the blood test results.

## 2019-09-26 NOTE — Assessment & Plan Note (Signed)
Single biggest risk factor is obesity.

## 2019-09-26 NOTE — Assessment & Plan Note (Addendum)
Screening lipid panel then calculate ASCVD risk LDL=210 - no need to run ASCVD risk - indication for high intensity statin.   Called patient.  Rx rosuvastatin Add ASA 81 mg FU 3 months for direct LDL

## 2019-09-26 NOTE — Assessment & Plan Note (Signed)
PSA

## 2019-09-26 NOTE — Progress Notes (Addendum)
   CHIEF COMPLAINT / HPI:  Annual physical plus problems 1. Sleep apnea, wears CPAP "all the time, even for naps."  Feels he needs setting adjusted.  Last sleep study was 2014 (that I can find.)  Likely needs new sleep study.   2. Morbid obesity.  States he has recently lost 10 lbs.  Review of flowsheet shows he is at highest weight on record. 3. FHX of DM 4. Just turned 49 which makes him due for colonoscopy.  He has noted some bright red blood occasionally on wiping.  More common after straining. 5. Hx of increased cholesterol.  Due for repeat lipids.  Will need to risk calculate with new findings.   6. Fungus nail on Right foot.  Failed topical.  Desires treatment. 7. KNows about prostate cancer screen controversy.  Desires screening. 8.  Non smoker, non drinker.    OBJECTIVE: BP 132/84   Pulse 75   Wt (!) 317 lb (143.8 kg)   SpO2 98%   BMI 45.48 kg/m    HEENT WNL Neck supple without masses. Lungs clear Cardiac RRR without m or g Abd benign.   Ext no edema.  Onychomycosis right great toenail. Neuro grossly normal.  ASSESSMENT / PLAN:  Prediabetes A1C=5.8 which is in the pre DM range.  Encounter for health maintenance examination Single biggest risk factor is obesity.  HYPERCHOLESTEROLEMIA Screening lipid panel then calculate ASCVD risk LDL=210 - no need to run ASCVD risk - indication for high intensity statin.   Called patient.  Rx rosuvastatin Add ASA 81 mg FU 3 months for direct LDL  Onychomycosis Rx with terbinafine.  Rectal bleeding REfer for colonoscopy.  Check CBC  Prostate cancer screening PSA     Zenia Resides, MD [place]

## 2019-09-27 LAB — LIPID PANEL
Chol/HDL Ratio: 6.4 ratio — ABNORMAL HIGH (ref 0.0–5.0)
Cholesterol, Total: 269 mg/dL — ABNORMAL HIGH (ref 100–199)
HDL: 42 mg/dL (ref >39)
LDL Chol Calc (NIH): 210 mg/dL — ABNORMAL HIGH (ref 0–99)
Triglycerides: 98 mg/dL (ref 0–149)
VLDL Cholesterol Cal: 17 mg/dL (ref 5–40)

## 2019-09-27 LAB — CBC
Hematocrit: 41.8 % (ref 37.5–51.0)
Hemoglobin: 14.2 g/dL (ref 13.0–17.7)
MCH: 30.7 pg (ref 26.6–33.0)
MCHC: 34 g/dL (ref 31.5–35.7)
MCV: 91 fL (ref 79–97)
Platelets: 188 10*3/uL (ref 150–450)
RBC: 4.62 x10E6/uL (ref 4.14–5.80)
RDW: 13 % (ref 11.6–15.4)
WBC: 4.7 10*3/uL (ref 3.4–10.8)

## 2019-09-27 LAB — CMP14+EGFR
ALT: 25 IU/L (ref 0–44)
AST: 26 IU/L (ref 0–40)
Albumin/Globulin Ratio: 1.2 (ref 1.2–2.2)
Albumin: 4.3 g/dL (ref 4.0–5.0)
Alkaline Phosphatase: 108 IU/L (ref 39–117)
BUN/Creatinine Ratio: 12 (ref 9–20)
BUN: 13 mg/dL (ref 6–24)
Bilirubin Total: 1 mg/dL (ref 0.0–1.2)
CO2: 22 mmol/L (ref 20–29)
Calcium: 9.1 mg/dL (ref 8.7–10.2)
Chloride: 104 mmol/L (ref 96–106)
Creatinine, Ser: 1.05 mg/dL (ref 0.76–1.27)
GFR calc Af Amer: 95 mL/min/{1.73_m2} (ref >59)
GFR calc non Af Amer: 82 mL/min/{1.73_m2} (ref >59)
Globulin, Total: 3.7 g/dL (ref 1.5–4.5)
Glucose: 94 mg/dL (ref 65–99)
Potassium: 4.1 mmol/L (ref 3.5–5.2)
Sodium: 139 mmol/L (ref 134–144)
Total Protein: 8 g/dL (ref 6.0–8.5)

## 2019-09-27 LAB — PSA: Prostate Specific Ag, Serum: 0.7 ng/mL (ref 0.0–4.0)

## 2019-09-27 LAB — TSH: TSH: 2.24 u[IU]/mL (ref 0.450–4.500)

## 2019-09-27 MED ORDER — ROSUVASTATIN CALCIUM 20 MG PO TABS: 20.0000 mg | ORAL_TABLET | Freq: Every day | ORAL | 3 refills | Status: DC

## 2019-09-27 MED ORDER — ASPIRIN EC 81 MG PO TBEC: 81.0000 mg | DELAYED_RELEASE_TABLET | Freq: Every day | ORAL | Status: AC

## 2019-09-27 NOTE — Addendum Note (Signed)
Addended by: Zenia Resides on: 09/27/2019 10:10 AM   Modules accepted: Orders

## 2019-09-29 ENCOUNTER — Encounter: Payer: Self-pay | Admitting: Nurse Practitioner

## 2019-09-29 ENCOUNTER — Ambulatory Visit (INDEPENDENT_AMBULATORY_CARE_PROVIDER_SITE_OTHER): Payer: Federal, State, Local not specified - PPO | Admitting: Nurse Practitioner

## 2019-09-29 VITALS — BP 120/78 | HR 72 | Temp 98.1°F | Ht 70.0 in | Wt 321.0 lb

## 2019-09-29 DIAGNOSIS — K625 Hemorrhage of anus and rectum: Secondary | ICD-10-CM | POA: Diagnosis not present

## 2019-09-29 DIAGNOSIS — Z01818 Encounter for other preprocedural examination: Secondary | ICD-10-CM

## 2019-09-29 DIAGNOSIS — Z1211 Encounter for screening for malignant neoplasm of colon: Secondary | ICD-10-CM

## 2019-09-29 NOTE — Progress Notes (Signed)
ASSESSMENT / PLAN:   49 year old male with intermittent painless rectal bleeding over the last 3 to 4 years.  Suspect this is hemorrhoidal bleeding. No anemia on labs a couple of days ago.  Patient needs a colonoscopy for colon cancer screening, further evaluation of bleeding at that time --The risks and benefits of colonoscopy with possible polypectomy / biopsies were discussed and the patient agrees to proceed.   HPI:    Referring Provider:    Zenia Resides, MD   Reason for referral:    Rectal bleeding  Chief Complaint:   Rectal bleeding  Joel West is a 49 year old male with PMH significant for sleep apnea, hyperlipidemia.  Over the last 3 to 4 years he has had remittent rectal bleeding with bowel movements.  He may go days or weeks without bleeding and then bleed for days at a time. Denies constipation/straining.  Patient says he is the age for colon cancer screening and just thought he needs to get things checked out.  He is not sure about a family history colon cancer, possibly some cousins may have had colon cancer.  He has no abdominal pain or other GI complaints.  Not anemic by recent labs   Data Reviewed:  Labs to 09/26/19 CMP normal CBC normal  Past Medical History:  Diagnosis Date  . Cellulitis 12/2013   RT LEG  . Headache(784.0)   . Hyperlipidemia   . Sleep apnea    USES CPAP     No past surgical history on file. Family History  Problem Relation Age of Onset  . Hyperlipidemia Mother   . Diabetes Father    Social History   Tobacco Use  . Smoking status: Never Smoker  . Smokeless tobacco: Never Used  Substance Use Topics  . Alcohol use: Yes    Alcohol/week: 6.0 - 8.0 standard drinks    Types: 6 - 8 drink(s) per week  . Drug use: No   Current Outpatient Medications  Medication Sig Dispense Refill  . aspirin EC 81 MG tablet Take 1 tablet (81 mg total) by mouth daily.    Marland Kitchen ibuprofen (ADVIL,MOTRIN) 800 MG tablet Take 1 tablet (800 mg total)  by mouth every 8 (eight) hours as needed. 100 tablet 6  . Olopatadine HCl (PATADAY) 0.2 % SOLN One drop to each eye once daily 2.5 mL 3  . rosuvastatin (CRESTOR) 20 MG tablet Take 1 tablet (20 mg total) by mouth daily. 90 tablet 3  . terbinafine (LAMISIL) 250 MG tablet Take 1 tablet (250 mg total) by mouth daily. 90 tablet 0   No current facility-administered medications for this visit.   No Known Allergies   Review of Systems: Positive for arthritis, back pain, swelling of feet and legs . All other systems reviewed and negative except where noted in HPI.   Creatinine clearance cannot be calculated (Unknown ideal weight.)   Physical Exam:    Wt Readings from Last 3 Encounters:  09/26/19 (!) 317 lb (143.8 kg)  07/15/18 (!) 311 lb (141.1 kg)  09/10/17 (!) 306 lb 9.6 oz (139.1 kg)    BP 120/78   Pulse 72   Temp 98.1 F (36.7 C)   Ht 5\' 10"  (1.778 m)   Wt (!) 321 lb (145.6 kg)   BMI 46.06 kg/m  Constitutional:  Pleasant male in no acute distress. Psychiatric: Normal mood and affect. Behavior is normal. EENT: Pupils normal.  Conjunctivae are normal. No scleral icterus. Neck supple.  Cardiovascular: Normal rate, regular rhythm. No edema Pulmonary/chest: Effort normal and breath sounds normal. No wheezing, rales or rhonchi. Abdominal: Soft, nondistended, nontender. Bowel sounds active throughout. There are no masses palpable. No hepatomegaly. Neurological: Alert and oriented to person place and time. Skin: Skin is warm and dry. No rashes noted.  Tye Savoy, NP  09/29/2019, 8:40 AM  Cc: Zenia Resides, MD

## 2019-09-29 NOTE — Patient Instructions (Signed)
If you are age 51 or older, your body mass index should be between 23-30. Your Body mass index is 46.06 kg/m. If this is out of the aforementioned range listed, please consider follow up with your Primary Care Provider.  If you are age 57 or younger, your body mass index should be between 19-25. Your Body mass index is 46.06 kg/m. If this is out of the aformentioned range listed, please consider follow up with your Primary Care Provider.   You have been scheduled for a colonoscopy. Please follow written instructions given to you at your visit today.  Please pick up your prep supplies at the pharmacy within the next 1-3 days. If you use inhalers (even only as needed), please bring them with you on the day of your procedure. Your physician has requested that you go to www.startemmi.com and enter the access code given to you at your visit today. This web site gives a general overview about your procedure. However, you should still follow specific instructions given to you by our office regarding your preparation for the procedure.  It was a pleasure to see you today!  Dr. Loletha Carrow

## 2019-10-06 ENCOUNTER — Other Ambulatory Visit: Payer: Self-pay | Admitting: Internal Medicine

## 2019-10-06 ENCOUNTER — Ambulatory Visit (INDEPENDENT_AMBULATORY_CARE_PROVIDER_SITE_OTHER): Payer: Federal, State, Local not specified - PPO

## 2019-10-06 DIAGNOSIS — Z1159 Encounter for screening for other viral diseases: Secondary | ICD-10-CM

## 2019-10-07 LAB — SARS CORONAVIRUS 2 (TAT 6-24 HRS): SARS Coronavirus 2: NEGATIVE

## 2019-10-10 ENCOUNTER — Encounter: Payer: Self-pay | Admitting: Internal Medicine

## 2019-10-10 ENCOUNTER — Other Ambulatory Visit: Payer: Self-pay

## 2019-10-10 ENCOUNTER — Ambulatory Visit (AMBULATORY_SURGERY_CENTER): Payer: Federal, State, Local not specified - PPO | Admitting: Internal Medicine

## 2019-10-10 VITALS — BP 141/70 | HR 59 | Temp 96.6°F | Resp 15

## 2019-10-10 DIAGNOSIS — D124 Benign neoplasm of descending colon: Secondary | ICD-10-CM | POA: Diagnosis not present

## 2019-10-10 DIAGNOSIS — Z1211 Encounter for screening for malignant neoplasm of colon: Secondary | ICD-10-CM | POA: Diagnosis not present

## 2019-10-10 MED ORDER — SODIUM CHLORIDE 0.9 % IV SOLN: 500.0000 mL | Freq: Once | INTRAVENOUS | Status: DC

## 2019-10-10 NOTE — Progress Notes (Signed)
Called to room to assist during endoscopic procedure.  Patient ID and intended procedure confirmed with present staff. Received instructions for my participation in the procedure from the performing physician.  

## 2019-10-10 NOTE — Op Note (Signed)
[facility] Patient Name: Joel West Procedure Date: 10/10/2019 8:52 AM MRN: [id number] Endoscopist: Gatha Mayer , MD Age: 49 Referring MD:  Date of Birth: [birth year] Gender: Male Account #: [id number] Procedure:                Colonoscopy Indications:              Screening for colorectal malignant neoplasm, This                            is the patient's first colonoscopy Medicines:                Propofol per Anesthesia, Monitored Anesthesia Care Procedure:                Pre-Anesthesia Assessment:                           - Prior to the procedure, a History and Physical                            was performed, and patient medications and                            allergies were reviewed. The patient's tolerance of                            previous anesthesia was also reviewed. The risks                            and benefits of the procedure and the sedation                            options and risks were discussed with the patient.                            All questions were answered, and informed consent                            was obtained. Prior Anticoagulants: The patient has                            taken no previous anticoagulant or antiplatelet                            agents. ASA Grade Assessment: III - A patient with                            severe systemic disease. After reviewing the risks                            and benefits, the patient was deemed in                            satisfactory condition to undergo the procedure.  After obtaining informed consent, the colonoscope                            was passed under direct vision. Throughout the                            procedure, the patient's blood pressure, pulse, and                            oxygen saturations were monitored continuously. The                            Colonoscope was introduced through the anus and                             advanced to the the cecum, identified by                            appendiceal orifice and ileocecal valve. The                            colonoscopy was performed without difficulty. The                            patient tolerated the procedure well. The quality                            of the bowel preparation was excellent. The bowel                            preparation used was Miralax via split dose                            instruction. Scope In: 9:03:51 AM Scope Out: 9:24:43 AM Scope Withdrawal Time: 0 hours 18 minutes 16 seconds  Total Procedure Duration: 0 hours 20 minutes 52 seconds  Findings:                 The perianal and digital rectal examinations were                            normal. Pertinent negatives include normal prostate                            (size, shape, and consistency).                           Two pedunculated and sessile polyps were found in                            the descending colon. The polyps were 2 to 8 mm in                            size. The 8 mm polyp was removed with a hot snare,  2 mm polyp by cold snare. Resection and retrieval                            were complete. Verification of patient                            identification for the specimen was done. Estimated                            blood loss was minimal.                           The exam was otherwise without abnormality on                            direct and retroflexion views. Complications:            No immediate complications. Estimated Blood Loss:     Estimated blood loss was minimal. Impression:               - Two 2 to 8 mm polyps in the descending colon,                            removed with a hot snare. Resected and retrieved.                           - The examination was otherwise normal on direct                            and retroflexion views. Recommendation:           - Patient has a contact number available for                             emergencies. The signs and symptoms of potential                            delayed complications were discussed with the                            patient. Return to normal activities tomorrow.                            Written discharge instructions were provided to the                            patient.                           - Resume previous diet.                           - Continue present medications.                           - No aspirin, ibuprofen, naproxen, or other  non-steroidal anti-inflammatory drugs for 2 weeks                            after polyp removal.                           - Repeat colonoscopy is recommended. The                            colonoscopy date will be determined after pathology                            results from today's exam become available for                            review. Gatha Mayer, MD 10/10/2019 9:32:29 AM This report has been signed electronically.

## 2019-10-10 NOTE — Progress Notes (Signed)
PT taken to PACU. Monitors in place. VSS. Report given to RN. 

## 2019-10-10 NOTE — Patient Instructions (Addendum)
I found and removed 2 small polyps  I will let you know pathology results and when to have another routine colonoscopy by mail and/or My Chart.  I appreciate the opportunity to care for you. Gatha Mayer, MD, [facility]   Handout given for polyps.  NO ASPIRIN, ASPIRIN CONTAINING PRODUCTS (BC OR GOODY POWDERS) OR NSAIDS (IBUPROFEN, ADVIL, ALEVE, AND MOTRIN) FOR 2 weeks; TYLENOL IS OK TO TAKE NO ASPIRIN, ASPIRIN CONTAINING PRODUCTS (BC OR GOODY POWDERS) OR NSAIDS (IBUPROFEN, ADVIL, ALEVE, AND MOTRIN) FOR 2 weeks; TYLENOL IS OK TO TAKE  YOU HAD AN ENDOSCOPIC PROCEDURE TODAY AT THE [facility]:   Refer to the procedure report that was given to you for any specific questions about what was found during the examination.  If the procedure report does not answer your questions, please call your gastroenterologist to clarify.  If you requested that your care partner not be given the details of your procedure findings, then the procedure report has been included in a sealed envelope for you to review at your convenience later.  YOU SHOULD EXPECT: Some feelings of bloating in the abdomen. Passage of more gas than usual.  Walking can help get rid of the air that was put into your GI tract during the procedure and reduce the bloating. If you had a lower endoscopy (such as a colonoscopy or flexible sigmoidoscopy) you may notice spotting of blood in your stool or on the toilet paper. If you underwent a bowel prep for your procedure, you may not have a normal bowel movement for a few days.  Please Note:  You might notice some irritation and congestion in your nose or some drainage.  This is from the oxygen used during your procedure.  There is no need for concern and it should clear up in a day or so.  SYMPTOMS TO REPORT IMMEDIATELY:   Following lower endoscopy (colonoscopy or flexible sigmoidoscopy):  Excessive amounts of blood in the stool  Significant tenderness or worsening of abdominal  pains  Swelling of the abdomen that is new, acute  Fever of 100F or higher  For urgent or emergent issues, a gastroenterologist can be reached at any hour by calling [phone].   DIET:  We do recommend a small meal at first, but then you may proceed to your regular diet.  Drink plenty of fluids but you should avoid alcoholic beverages for 24 hours.  ACTIVITY:  You should plan to take it easy for the rest of today and you should NOT DRIVE or use heavy machinery until tomorrow (because of the sedation medicines used during the test).    FOLLOW UP: Our staff will call the number listed on your records 48-72 hours following your procedure to check on you and address any questions or concerns that you may have regarding the information given to you following your procedure. If we do not reach you, we will leave a message.  We will attempt to reach you two times.  During this call, we will ask if you have developed any symptoms of COVID 19. If you develop any symptoms (ie: fever, flu-like symptoms, shortness of breath, cough etc.) before then, please call [phone].  If you test positive for Covid 19 in the 2 weeks post procedure, please call and report this information to [place].    If any biopsies were taken you will be contacted by phone or by letter within the next 1-3 weeks.  Please call us at [phone] if you  have not heard about the biopsies in 3 weeks.    SIGNATURES/CONFIDENTIALITY: You and/or your care partner have signed paperwork which will be entered into your electronic medical record.  These signatures attest to the fact that that the information above on your After Visit Summary has been reviewed and is understood.  Full responsibility of the confidentiality of this discharge information lies with you and/or your care-partner.

## 2019-10-12 ENCOUNTER — Telehealth: Payer: Self-pay | Admitting: *Deleted

## 2019-10-12 NOTE — Telephone Encounter (Signed)
1. Have you developed a fever since your procedure? no  2.   Have you had an respiratory symptoms (SOB or cough) since your procedure? no  3.   Have you tested positive for COVID 19 since your procedure no  4.   Have you had any family members/close contacts diagnosed with the COVID 19 since your procedure?  no   If yes to any of these questions please route to Joylene John, RN and Alphonsa Gin, Therapist, sports.  Follow up Call-  Call back number 10/10/2019  Post procedure Call Back phone  # [phone]  Permission to leave phone message Yes  Some recent data might be hidden     Patient questions:  Do you have a fever, pain , or abdominal swelling? No. Pain Score  0 *  Have you tolerated food without any problems? Yes.    Have you been able to return to your normal activities? Yes.    Do you have any questions about your discharge instructions: Diet   No. Medications  No. Follow up visit  No.  Do you have questions or concerns about your Care? No.  Actions: * If pain score is 4 or above: No action needed, pain <4.

## 2019-10-14 ENCOUNTER — Encounter: Payer: Self-pay | Admitting: Internal Medicine

## 2019-10-14 DIAGNOSIS — Z86010 Personal history of colonic polyps: Secondary | ICD-10-CM | POA: Insufficient documentation

## 2019-10-14 DIAGNOSIS — Z860101 Personal history of adenomatous and serrated colon polyps: Secondary | ICD-10-CM

## 2019-10-14 HISTORY — DX: Personal history of colonic polyps: Z86.010

## 2019-10-14 HISTORY — DX: Personal history of adenomatous and serrated colon polyps: Z86.0101

## 2019-12-12 ENCOUNTER — Other Ambulatory Visit: Payer: Self-pay | Admitting: Family Medicine

## 2019-12-12 DIAGNOSIS — M159 Polyosteoarthritis, unspecified: Secondary | ICD-10-CM

## 2019-12-12 DIAGNOSIS — M8949 Other hypertrophic osteoarthropathy, multiple sites: Secondary | ICD-10-CM

## 2019-12-25 ENCOUNTER — Other Ambulatory Visit: Payer: Self-pay | Admitting: Family Medicine

## 2019-12-25 DIAGNOSIS — B351 Tinea unguium: Secondary | ICD-10-CM

## 2019-12-28 ENCOUNTER — Telehealth: Payer: Self-pay

## 2019-12-28 NOTE — Telephone Encounter (Signed)
Patient calls nurse line inquiring as to why his medication refill was denied.   Spoke with Dr. Andria Frames, who advised if patient was still having issues would need to come into office to be seen. Refill would not be appropriate due to length of symptoms and potential side effects of long-term use.   Patient informed and will follow up after returning from his trip.   Talbot Grumbling, RN

## 2019-12-29 NOTE — Telephone Encounter (Signed)
Noted  

## 2020-02-16 ENCOUNTER — Ambulatory Visit (INDEPENDENT_AMBULATORY_CARE_PROVIDER_SITE_OTHER): Payer: Federal, State, Local not specified - PPO | Admitting: Family Medicine

## 2020-02-16 ENCOUNTER — Other Ambulatory Visit: Payer: Self-pay

## 2020-02-16 ENCOUNTER — Encounter: Payer: Self-pay | Admitting: Family Medicine

## 2020-02-16 VITALS — BP 112/60 | HR 61 | Ht 70.0 in | Wt 328.0 lb

## 2020-02-16 DIAGNOSIS — L739 Follicular disorder, unspecified: Secondary | ICD-10-CM

## 2020-02-16 DIAGNOSIS — M79674 Pain in right toe(s): Secondary | ICD-10-CM

## 2020-02-16 MED ORDER — DOXYCYCLINE HYCLATE 100 MG PO TABS: 100.0000 mg | ORAL_TABLET | Freq: Two times a day (BID) | ORAL | 0 refills | Status: AC

## 2020-02-16 NOTE — Assessment & Plan Note (Signed)
Third toe on right foot has been in pain for approximately 1 month since patient got it caught up in some close while walking around her hotel room and tripped and fell.  He said it twisted very far to the right and he thinks that he might have broke it.  There does appear to be a slight disangulation laterally at the second joint of the toe but he still has good cap refill, sensation, motor control.  Pain is a 2 out of 10 and constant, has been resolving slowly.   Discussed this could have been a strain versus a dislocation versus a less likely angulating fracture, given the 1 month since and the minimal angulation at this point that it is unlikely surgery would be necessary.  We did offer an x-ray if the patient insisted and he decided that it would be unnecessary and he would try buddy tape and over-the-counter ibuprofen/Tylenol for the next week to see if that helps out

## 2020-02-16 NOTE — Assessment & Plan Note (Signed)
Patient with pustular folliculitis around the posterior lower end of his scalp.  He does go to the barber regularly and although he does not use a straight razor does use clippers to buzz a hair to the skin line he likes to go "slick bald "he said his barber told him that him manipulating and scratching at the back of his head probably makes his worse.  Agree this is likely folliculitis and scratching it constantly does not help.  Will order 1 week of doxycycline as well as advised him that the best treatment for folliculitis versus pseudofolliculitis is to stop lysing the hair to the skin line and to let it be slightly longer.  Patient is unsure he will do this but will consider

## 2020-02-16 NOTE — Assessment & Plan Note (Signed)
Patient does have some chronic knee arthritis that he complains of, agrees that weight control might be one of the better options for him to try and slow down the progression of this so he would like to speak to nutrition.  Of note he is insistent that he only eats vegetables fruits and chicken and rarely ever drinks any sugars.  Charting indicates that he has gained 10 pounds in the last few months.  Discussed using either the my fitness pal app or a written food diary for 3 days straight and have put in referral for nutrition for him to discuss his diet control with them

## 2020-02-16 NOTE — Patient Instructions (Signed)
Today we talked about your knee and foot pain.  I think this is chronic arthritis and possibly a ligament strain from twisting her toe around.  Given that this was over a month ago and you are still able to ambulate I do not think there is much utility to getting an x-ray for this.  It should continue to calm down over the next few weeks and if it does not please let us know.  I think the bumps on the back your head are folliculitis, I am going to give you a short course of an antibiotic called doxycycline which should help calm this down.  My other advice is to not shave your hair to the skin line.  A lot of folks find that this does not recur if they can avoid doing that.  You can try and see if that works for you.  We also talked about trying to lose weight so I am going to give you a referral to the nutritionist.  She will see this in the computer but she does not call you.  I am giving you the card and you need to call her.  Please keep a food diary of everything you eat for 3 days which will help her get an idea of your normal food habits.  Also you can try downloading the my fitness pal app which will help you track your calories and get a better food record of what you eat on a regular basis.  Dr. Criss Rosales

## 2020-02-16 NOTE — Progress Notes (Signed)
SUBJECTIVE:   CHIEF COMPLAINT / HPI: multiple complaints  Pain of toe of right foot Third toe on right foot has been in pain for approximately 1 month since patient got it caught up in some close while walking around her hotel room and tripped and fell.  He said it twisted very far to the right and he thinks that he might have broke it.  There does appear to be a slight disangulation laterally at the second joint of the toe but he still has good cap refill, sensation, motor control.  Pain is a 2 out of 10 and constant, has been resolving slowly.   Morbid obesity ([place]) Patient does have some chronic knee arthritis that he complains of, agrees that weight control might be one of the better options for him to try and slow down the progression of this so he would like to speak to nutrition.  Of note he is insistent that he only eats vegetables fruits and chicken and rarely ever drinks any sugars.  Charting indicates that he has gained 10 pounds in the last few months.  Folliculitis Patient with pustular folliculitis around the posterior lower end of his scalp.  He does go to the barber regularly and although he does not use a straight razor does use clippers to buzz a hair to the skin line he likes to go "slick bald "he said his barber told him that him manipulating and scratching at the back of his head probably makes his worse.  PERTINENT  PMH / PSH:   OBJECTIVE:   BP 112/60   Pulse 61   Ht 5\' 10"  (1.778 m)   Wt (!) 328 lb (148.8 kg)   SpO2 99%   BMI 47.06 kg/m   General: Alert and pleasant, obese MSK: Knee with no laxity indicated on exam, there is crepitus with flexion extension.  No warmth or erythema indicating cellulitis or septic joint.  Right third toe with the second joint angulation laterally about 10 degrees.  There is still good flexion and extension along with cap refill and sensation intact.  No indication of cellulitis or break in the skin  ASSESSMENT/PLAN:   Pain of  toe of right foot Third toe on right foot has been in pain for approximately 1 month since patient got it caught up in some close while walking around her hotel room and tripped and fell.  He said it twisted very far to the right and he thinks that he might have broke it.  There does appear to be a slight disangulation laterally at the second joint of the toe but he still has good cap refill, sensation, motor control.  Pain is a 2 out of 10 and constant, has been resolving slowly.   Discussed this could have been a strain versus a dislocation versus a less likely angulating fracture, given the 1 month since and the minimal angulation at this point that it is unlikely surgery would be necessary.  We did offer an x-ray if the patient insisted and he decided that it would be unnecessary and he would try buddy tape and over-the-counter ibuprofen/Tylenol for the next week to see if that helps out  Morbid obesity ([place]) Patient does have some chronic knee arthritis that he complains of, agrees that weight control might be one of the better options for him to try and slow down the progression of this so he would like to speak to nutrition.  Of note he is insistent that he  only eats vegetables fruits and chicken and rarely ever drinks any sugars.  Charting indicates that he has gained 10 pounds in the last few months.  Discussed using either the my fitness pal app or a written food diary for 3 days straight and have put in referral for nutrition for him to discuss his diet control with them  Folliculitis Patient with pustular folliculitis around the posterior lower end of his scalp.  He does go to the barber regularly and although he does not use a straight razor does use clippers to buzz a hair to the skin line he likes to go "slick bald "he said his barber told him that him manipulating and scratching at the back of his head probably makes his worse.  Agree this is likely folliculitis and scratching it  constantly does not help.  Will order 1 week of doxycycline as well as advised him that the best treatment for folliculitis versus pseudofolliculitis is to stop lysing the hair to the skin line and to let it be slightly longer.  Patient is unsure he will do this but will consider     Sherene Sires, [place]

## 2020-02-21 DIAGNOSIS — G4733 Obstructive sleep apnea (adult) (pediatric): Secondary | ICD-10-CM | POA: Diagnosis not present

## 2020-04-16 ENCOUNTER — Telehealth: Payer: Self-pay

## 2020-04-16 NOTE — Telephone Encounter (Signed)
Patient calls nurse line regarding cough, fever, chills and headache for the last few days. Patient reports that he received his second COVID dose on 04/08/20. Patient reports tmax of 102 around 0830 this morning. Advised patient to take fever reducing medication, tylenol or ibuprofen and to receive COVID testing. Offered patient appointment in [facility], patient declines at this time.  Patient reports that he will be tested at local drug store and return call to office with test results and if he decides to schedule an appointment.   Strict ED precautions given.   Talbot Grumbling, RN

## 2020-04-16 NOTE — Telephone Encounter (Signed)
Reviewed and agree.

## 2020-04-19 ENCOUNTER — Other Ambulatory Visit: Payer: Self-pay

## 2020-04-19 ENCOUNTER — Inpatient Hospital Stay (HOSPITAL_COMMUNITY)
Admission: EM | Admit: 2020-04-19 | Discharge: 2020-04-23 | DRG: 177 | Disposition: A | Discharge status: Home / Self Care | Payer: Federal, State, Local not specified - PPO | Attending: Family Medicine | Admitting: Family Medicine

## 2020-04-19 ENCOUNTER — Encounter (HOSPITAL_COMMUNITY): Payer: Self-pay | Admitting: Emergency Medicine

## 2020-04-19 DIAGNOSIS — M5416 Radiculopathy, lumbar region: Secondary | ICD-10-CM | POA: Diagnosis not present

## 2020-04-19 DIAGNOSIS — Z86010 Personal history of colonic polyps: Secondary | ICD-10-CM | POA: Diagnosis not present

## 2020-04-19 DIAGNOSIS — B351 Tinea unguium: Secondary | ICD-10-CM | POA: Diagnosis not present

## 2020-04-19 DIAGNOSIS — Z7982 Long term (current) use of aspirin: Secondary | ICD-10-CM

## 2020-04-19 DIAGNOSIS — R197 Diarrhea, unspecified: Secondary | ICD-10-CM | POA: Diagnosis present

## 2020-04-19 DIAGNOSIS — J9811 Atelectasis: Secondary | ICD-10-CM | POA: Diagnosis present

## 2020-04-19 DIAGNOSIS — G4733 Obstructive sleep apnea (adult) (pediatric): Secondary | ICD-10-CM | POA: Diagnosis present

## 2020-04-19 DIAGNOSIS — J9601 Acute respiratory failure with hypoxia: Secondary | ICD-10-CM | POA: Diagnosis not present

## 2020-04-19 DIAGNOSIS — R0602 Shortness of breath: Secondary | ICD-10-CM | POA: Diagnosis not present

## 2020-04-19 DIAGNOSIS — J1282 Pneumonia due to coronavirus disease 2019: Secondary | ICD-10-CM | POA: Diagnosis not present

## 2020-04-19 DIAGNOSIS — E785 Hyperlipidemia, unspecified: Secondary | ICD-10-CM | POA: Diagnosis not present

## 2020-04-19 DIAGNOSIS — Z6841 Body Mass Index (BMI) 40.0 and over, adult: Secondary | ICD-10-CM

## 2020-04-19 DIAGNOSIS — Z79899 Other long term (current) drug therapy: Secondary | ICD-10-CM

## 2020-04-19 DIAGNOSIS — M1990 Unspecified osteoarthritis, unspecified site: Secondary | ICD-10-CM | POA: Diagnosis present

## 2020-04-19 DIAGNOSIS — M79676 Pain in unspecified toe(s): Secondary | ICD-10-CM | POA: Diagnosis not present

## 2020-04-19 DIAGNOSIS — U071 COVID-19: Principal | ICD-10-CM | POA: Diagnosis present

## 2020-04-19 DIAGNOSIS — Z9981 Dependence on supplemental oxygen: Secondary | ICD-10-CM

## 2020-04-19 DIAGNOSIS — E119 Type 2 diabetes mellitus without complications: Secondary | ICD-10-CM | POA: Diagnosis present

## 2020-04-19 DIAGNOSIS — F419 Anxiety disorder, unspecified: Secondary | ICD-10-CM | POA: Diagnosis not present

## 2020-04-19 DIAGNOSIS — N179 Acute kidney failure, unspecified: Secondary | ICD-10-CM | POA: Diagnosis not present

## 2020-04-19 DIAGNOSIS — L739 Follicular disorder, unspecified: Secondary | ICD-10-CM | POA: Diagnosis present

## 2020-04-19 DIAGNOSIS — Z83438 Family history of other disorder of lipoprotein metabolism and other lipidemia: Secondary | ICD-10-CM

## 2020-04-19 DIAGNOSIS — I517 Cardiomegaly: Secondary | ICD-10-CM | POA: Diagnosis present

## 2020-04-19 DIAGNOSIS — R7982 Elevated C-reactive protein (CRP): Secondary | ICD-10-CM | POA: Diagnosis present

## 2020-04-19 DIAGNOSIS — Z833 Family history of diabetes mellitus: Secondary | ICD-10-CM

## 2020-04-19 DIAGNOSIS — E7800 Pure hypercholesterolemia, unspecified: Secondary | ICD-10-CM | POA: Diagnosis present

## 2020-04-19 DIAGNOSIS — Z803 Family history of malignant neoplasm of breast: Secondary | ICD-10-CM

## 2020-04-19 LAB — CBC
HCT: 45.2 % (ref 39.0–52.0)
Hemoglobin: 14.8 g/dL (ref 13.0–17.0)
MCH: 29.1 pg (ref 26.0–34.0)
MCHC: 32.7 g/dL (ref 30.0–36.0)
MCV: 88.8 fL (ref 80.0–100.0)
Platelets: 107 10*3/uL — ABNORMAL LOW (ref 150–400)
RBC: 5.09 MIL/uL (ref 4.22–5.81)
RDW: 12.7 % (ref 11.5–15.5)
WBC: 6.9 10*3/uL (ref 4.0–10.5)
nRBC: 0 % (ref 0.0–0.2)

## 2020-04-19 LAB — COMPREHENSIVE METABOLIC PANEL
ALT: 56 U/L — ABNORMAL HIGH (ref 0–44)
AST: 103 U/L — ABNORMAL HIGH (ref 15–41)
Albumin: 3.9 g/dL (ref 3.5–5.0)
Alkaline Phosphatase: 60 U/L (ref 38–126)
Anion gap: 13 (ref 5–15)
BUN: 10 mg/dL (ref 6–20)
CO2: 23 mmol/L (ref 22–32)
Calcium: 8.6 mg/dL — ABNORMAL LOW (ref 8.9–10.3)
Chloride: 102 mmol/L (ref 98–111)
Creatinine, Ser: 1.27 mg/dL — ABNORMAL HIGH (ref 0.61–1.24)
GFR calc Af Amer: >60 mL/min (ref >60)
GFR calc non Af Amer: >60 mL/min (ref >60)
Glucose, Bld: 144 mg/dL — ABNORMAL HIGH (ref 70–99)
Potassium: 3.6 mmol/L (ref 3.5–5.1)
Sodium: 138 mmol/L (ref 135–145)
Total Bilirubin: 0.8 mg/dL (ref 0.3–1.2)
Total Protein: 8.2 g/dL — ABNORMAL HIGH (ref 6.5–8.1)

## 2020-04-19 LAB — LIPASE, BLOOD: Lipase: 31 U/L (ref 11–51)

## 2020-04-19 NOTE — Telephone Encounter (Signed)
Noted and agree. 

## 2020-04-19 NOTE — Telephone Encounter (Signed)
Patient LVM on nurse line reporting continued covid symptoms. Patient reports he did go and get covid tested, however results are not back yet. Patient did state he feels like he needs to go to the hospital. I attempted to call patient back, however I had to LVM. I informed patient if he feels he needs to seek immediate care he needs to do so. I asked patient to call us back when he could.

## 2020-04-19 NOTE — ED Triage Notes (Signed)
Pt c/o fever, weakness and diarrhea x 6 days. Last dose tylenol 1 hour pta. Pt states he has been fully vaccinated for covid.

## 2020-04-20 ENCOUNTER — Encounter: Payer: Self-pay | Admitting: Family Medicine

## 2020-04-20 ENCOUNTER — Observation Stay (HOSPITAL_COMMUNITY): Payer: Federal, State, Local not specified - PPO

## 2020-04-20 DIAGNOSIS — J90 Pleural effusion, not elsewhere classified: Secondary | ICD-10-CM | POA: Diagnosis not present

## 2020-04-20 DIAGNOSIS — J1282 Pneumonia due to coronavirus disease 2019: Secondary | ICD-10-CM | POA: Diagnosis present

## 2020-04-20 DIAGNOSIS — U071 COVID-19: Secondary | ICD-10-CM | POA: Diagnosis present

## 2020-04-20 DIAGNOSIS — R509 Fever, unspecified: Secondary | ICD-10-CM | POA: Diagnosis not present

## 2020-04-20 DIAGNOSIS — N179 Acute kidney failure, unspecified: Secondary | ICD-10-CM | POA: Insufficient documentation

## 2020-04-20 LAB — URINALYSIS, ROUTINE W REFLEX MICROSCOPIC
Bacteria, UA: NONE SEEN
Bilirubin Urine: NEGATIVE
Glucose, UA: NEGATIVE mg/dL
Ketones, ur: 5 mg/dL — AB
Leukocytes,Ua: NEGATIVE
Nitrite: NEGATIVE
Protein, ur: 100 mg/dL — AB
Specific Gravity, Urine: 1.024 (ref 1.005–1.030)
pH: 6 (ref 5.0–8.0)

## 2020-04-20 LAB — ABO/RH: ABO/RH(D): A POS

## 2020-04-20 LAB — SARS CORONAVIRUS 2 BY RT PCR (HOSPITAL ORDER, PERFORMED IN ~~LOC~~ HOSPITAL LAB): SARS Coronavirus 2: POSITIVE — AB

## 2020-04-20 MED ORDER — SODIUM CHLORIDE 0.9 % IV SOLN: INTRAVENOUS | Status: DC | PRN

## 2020-04-20 MED ORDER — ALBUTEROL SULFATE HFA 108 (90 BASE) MCG/ACT IN AERS
2.0000 | INHALATION_SPRAY | Freq: Once | RESPIRATORY_TRACT | Status: DC | PRN
  Filled 2020-04-20: qty 6.7

## 2020-04-20 MED ORDER — SODIUM CHLORIDE 0.9 % IV BOLUS
1000.0000 mL | Freq: Once | INTRAVENOUS | Status: AC
  Administered 2020-04-20: 1000 mL via INTRAVENOUS

## 2020-04-20 MED ORDER — DIPHENHYDRAMINE HCL 50 MG/ML IJ SOLN: 50.0000 mg | Freq: Once | INTRAMUSCULAR | Status: DC | PRN

## 2020-04-20 MED ORDER — SODIUM CHLORIDE 0.9 % IV SOLN
100.0000 mg | Freq: Every day | INTRAVENOUS | Status: DC
  Administered 2020-04-21 – 2020-04-23 (×3): 100 mg via INTRAVENOUS
  Filled 2020-04-20 (×4): qty 20

## 2020-04-20 MED ORDER — SODIUM CHLORIDE 0.9 % IV SOLN
1200.0000 mg | Freq: Once | INTRAVENOUS | Status: AC
  Administered 2020-04-20: 1200 mg via INTRAVENOUS
  Filled 2020-04-20: qty 10

## 2020-04-20 MED ORDER — GUAIFENESIN-DM 100-10 MG/5ML PO SYRP
10.0000 mL | ORAL_SOLUTION | ORAL | Status: DC | PRN
  Administered 2020-04-21 (×2): 10 mL via ORAL
  Filled 2020-04-20 (×2): qty 10

## 2020-04-20 MED ORDER — METHYLPREDNISOLONE SODIUM SUCC 125 MG IJ SOLR: 125.0000 mg | Freq: Once | INTRAMUSCULAR | Status: DC | PRN

## 2020-04-20 MED ORDER — DEXAMETHASONE 6 MG PO TABS
6.0000 mg | ORAL_TABLET | ORAL | Status: DC
  Administered 2020-04-20 – 2020-04-23 (×4): 6 mg via ORAL
  Filled 2020-04-20: qty 1
  Filled 2020-04-20: qty 2
  Filled 2020-04-20 (×2): qty 1

## 2020-04-20 MED ORDER — HYDROCOD POLST-CPM POLST ER 10-8 MG/5ML PO SUER
5.0000 mL | Freq: Once | ORAL | Status: AC
  Administered 2020-04-20: 5 mL via ORAL
  Filled 2020-04-20: qty 5

## 2020-04-20 MED ORDER — ACETAMINOPHEN 325 MG PO TABS
650.0000 mg | ORAL_TABLET | Freq: Once | ORAL | Status: AC | PRN
  Administered 2020-04-20: 650 mg via ORAL
  Filled 2020-04-20: qty 2

## 2020-04-20 MED ORDER — ACETAMINOPHEN 325 MG PO TABS
650.0000 mg | ORAL_TABLET | Freq: Four times a day (QID) | ORAL | Status: DC | PRN
  Administered 2020-04-20 – 2020-04-21 (×2): 650 mg via ORAL
  Filled 2020-04-20 (×3): qty 2

## 2020-04-20 MED ORDER — ENOXAPARIN SODIUM 80 MG/0.8ML ~~LOC~~ SOLN
75.0000 mg | Freq: Every day | SUBCUTANEOUS | Status: DC
  Administered 2020-04-20 – 2020-04-23 (×4): 75 mg via SUBCUTANEOUS
  Filled 2020-04-20: qty 0.75
  Filled 2020-04-20 (×3): qty 0.8

## 2020-04-20 MED ORDER — ROSUVASTATIN CALCIUM 20 MG PO TABS
20.0000 mg | ORAL_TABLET | Freq: Every day | ORAL | Status: DC
  Administered 2020-04-21 – 2020-04-23 (×3): 20 mg via ORAL
  Filled 2020-04-20 (×3): qty 1

## 2020-04-20 MED ORDER — ASPIRIN EC 81 MG PO TBEC
81.0000 mg | DELAYED_RELEASE_TABLET | Freq: Every day | ORAL | Status: DC
  Administered 2020-04-20 – 2020-04-23 (×4): 81 mg via ORAL
  Filled 2020-04-20 (×4): qty 1

## 2020-04-20 MED ORDER — ENOXAPARIN SODIUM 40 MG/0.4ML ~~LOC~~ SOLN: 40.0000 mg | Freq: Every day | SUBCUTANEOUS | Status: DC

## 2020-04-20 MED ORDER — EPINEPHRINE 0.3 MG/0.3ML IJ SOAJ
0.3000 mg | Freq: Once | INTRAMUSCULAR | Status: DC | PRN
  Filled 2020-04-20: qty 0.6

## 2020-04-20 MED ORDER — SODIUM CHLORIDE 0.9 % IV SOLN
200.0000 mg | Freq: Once | INTRAVENOUS | Status: AC
  Administered 2020-04-20: 200 mg via INTRAVENOUS
  Filled 2020-04-20: qty 40

## 2020-04-20 MED ORDER — POLYETHYLENE GLYCOL 3350 17 G PO PACK: 17.0000 g | PACK | Freq: Every day | ORAL | Status: DC | PRN

## 2020-04-20 MED ORDER — FAMOTIDINE IN NACL 20-0.9 MG/50ML-% IV SOLN
20.0000 mg | Freq: Once | INTRAVENOUS | Status: DC | PRN
  Filled 2020-04-20: qty 50

## 2020-04-20 MED ORDER — KETOROLAC TROMETHAMINE 30 MG/ML IJ SOLN
15.0000 mg | Freq: Once | INTRAMUSCULAR | Status: AC
  Administered 2020-04-20: 15 mg via INTRAVENOUS
  Filled 2020-04-20: qty 1

## 2020-04-20 NOTE — ED Provider Notes (Signed)
[facility] EMERGENCY DEPARTMENT Provider Note   CSN: [id number] Arrival date & time: 04/19/20  2118     History Chief Complaint  Patient presents with  . Fever    Joel West is a 49 y.o. male.  49 yo M with a chief complaints of cough congestion shortness of breath fevers myalgias and diarrhea.  Going on for about a week now.  No known suspicious contacts.  Recently obtained his second coronavirus vaccination.  No abdominal pain no vomiting eating and drinking but less than normal.  The history is provided by the patient.  Fever Associated symptoms: chills, cough, diarrhea and myalgias   Associated symptoms: no chest pain, no confusion, no congestion, no headaches, no nausea, no rash and no vomiting   Illness Severity:  Moderate Onset quality:  Gradual Duration:  1 week Timing:  Constant Progression:  Worsening Chronicity:  New Associated symptoms: cough, diarrhea, fever, myalgias and shortness of breath   Associated symptoms: no abdominal pain, no chest pain, no congestion, no headaches, no nausea, no rash and no vomiting        Past Medical History:  Diagnosis Date  . Anxiety   . Cellulitis 12/2013   RT LEG  . Headache(784.0)   . Hx of adenomatous colonic polyps 10/14/2019   09/2019 2 adenomas max 8 mm  . Hyperlipidemia   . Sleep apnea    USES CPAP    Patient Active Problem List   Diagnosis Date Noted  . Pneumonia due to COVID-19 virus 04/20/2020  . AKI (acute kidney injury) ([place])   . Folliculitis 81/27/5170  . Pain of toe of right foot 02/16/2020  . Hx of adenomatous colonic polyps 10/14/2019  . Onychomycosis 09/26/2019  . Prostate cancer screening 09/26/2019  . Prediabetes 09/26/2019  . Encounter for health maintenance examination 12/24/2016  . Osteoarthritis, multiple sites 12/24/2016  . Lumbar back pain with radiculopathy affecting left lower extremity 02/16/2015  . Obstructive sleep apnea 10/22/2012  . HYPERCHOLESTEROLEMIA  10/22/2006  . Morbid obesity ([place]) 10/22/2006    Past Surgical History:  Procedure Laterality Date  . NONE         Family History  Problem Relation Age of Onset  . Hyperlipidemia Mother   . Diabetes Father   . Diabetes type I Sister   . Breast cancer Paternal Aunt   . Cancer Paternal Aunt        unknow type    Social History   Tobacco Use  . Smoking status: Never Smoker  . Smokeless tobacco: Never Used  Vaping Use  . Vaping Use: Never used  Substance Use Topics  . Alcohol use: Yes    Comment: 1 drink every 2-3 weeks  . Drug use: No    Home Medications Prior to Admission medications   Medication Sig Start Date End Date Taking? Authorizing Provider  aspirin EC 81 MG tablet Take 1 tablet (81 mg total) by mouth daily. 09/27/19   Zenia Resides, MD  ibuprofen (ADVIL) 800 MG tablet TAKE 1 TABLET BY MOUTH EVERY 8 HOURS AS NEEDED 12/12/19   Zenia Resides, MD  terbinafine (LAMISIL) 250 MG tablet Take 1 tablet (250 mg total) by mouth daily. 09/26/19   Zenia Resides, MD    Allergies    Patient has no known allergies.  Review of Systems   Review of Systems  Constitutional: Positive for chills and fever.  HENT: Negative for congestion and facial swelling.   Eyes: Negative for discharge and  visual disturbance.  Respiratory: Positive for cough and shortness of breath.   Cardiovascular: Negative for chest pain and palpitations.  Gastrointestinal: Positive for diarrhea. Negative for abdominal pain, nausea and vomiting.  Musculoskeletal: Positive for myalgias. Negative for arthralgias.  Skin: Negative for color change and rash.  Neurological: Negative for tremors, syncope and headaches.  Psychiatric/Behavioral: Negative for confusion and dysphoric mood.    Physical Exam Updated Vital Signs BP 137/82   Pulse 84   Temp (!) 102.7 F (39.3 C) (Oral)   Resp (!) 29   SpO2 94%   Physical Exam Vitals and nursing note reviewed.  Constitutional:      Appearance: He is  well-developed.  HENT:     Head: Normocephalic and atraumatic.  Eyes:     Pupils: Pupils are equal, round, and reactive to light.  Neck:     Vascular: No JVD.  Cardiovascular:     Rate and Rhythm: Normal rate and regular rhythm.     Heart sounds: No murmur heard.  No friction rub. No gallop.   Pulmonary:     Effort: No respiratory distress.     Breath sounds: No wheezing.     Comments: Coarse breath sounds in all fields Abdominal:     General: There is no distension.     Tenderness: There is no abdominal tenderness. There is no guarding or rebound.  Musculoskeletal:        General: Normal range of motion.     Cervical back: Normal range of motion and neck supple.  Skin:    Coloration: Skin is not pale.     Findings: No rash.  Neurological:     Mental Status: He is alert and oriented to person, place, and time.  Psychiatric:        Behavior: Behavior normal.     ED Results / Procedures / Treatments   Labs (all labs ordered are listed, but only abnormal results are displayed) Labs Reviewed  SARS CORONAVIRUS 2 BY RT PCR (HOSPITAL ORDER, [place] [facility]) - Abnormal; Notable for the following components:      Result Value   SARS Coronavirus 2 POSITIVE (*)    All other components within normal limits  COMPREHENSIVE METABOLIC PANEL - Abnormal; Notable for the following components:   Glucose, Bld 144 (*)    Creatinine, Ser 1.27 (*)    Calcium 8.6 (*)    Total Protein 8.2 (*)    AST 103 (*)    ALT 56 (*)    All other components within normal limits  CBC - Abnormal; Notable for the following components:   Platelets 107 (*)    All other components within normal limits  URINALYSIS, ROUTINE W REFLEX MICROSCOPIC - Abnormal; Notable for the following components:   APPearance HAZY (*)    Hgb urine dipstick MODERATE (*)    Ketones, ur 5 (*)    Protein, ur 100 (*)    All other components within normal limits  LIPASE, BLOOD  HIV ANTIBODY (ROUTINE TESTING W  REFLEX)  ABO/RH    EKG None  Radiology Portable chest 1 View  Result Date: 04/20/2020 CLINICAL DATA:  Shortness of breath, fever. EXAM: PORTABLE CHEST 1 VIEW COMPARISON:  None. FINDINGS: The heart size and mediastinal contours are within normal limits. No pneumothorax pleural effusion is noted. Mild bibasilar opacities are noted concerning for subsegmental atelectasis or infiltrates. The visualized skeletal structures are unremarkable. IMPRESSION: Mild bibasilar subsegmental atelectasis or infiltrates are noted. Electronically Signed  By: Marijo Conception M.D.   On: 04/20/2020 12:46    Procedures Procedures (including critical care time)  Medications Ordered in ED Medications  0.9 %  sodium chloride infusion (has no administration in time range)  diphenhydrAMINE (BENADRYL) injection 50 mg (has no administration in time range)  famotidine (PEPCID) IVPB 20 mg premix (has no administration in time range)  methylPREDNISolone sodium succinate (SOLU-MEDROL) 125 mg/2 mL injection 125 mg (has no administration in time range)  albuterol (VENTOLIN HFA) 108 (90 Base) MCG/ACT inhaler 2 puff (has no administration in time range)  EPINEPHrine (EPI-PEN) injection 0.3 mg (has no administration in time range)  aspirin EC tablet 81 mg (81 mg Oral Given 04/20/20 1428)  dexamethasone (DECADRON) tablet 6 mg (6 mg Oral Given 04/20/20 1427)  remdesivir 200 mg in sodium chloride 0.9% 250 mL IVPB (200 mg Intravenous New Bag/Given 04/20/20 1420)    Followed by  remdesivir 100 mg in sodium chloride 0.9 % 100 mL IVPB (has no administration in time range)  guaiFENesin-dextromethorphan (ROBITUSSIN DM) 100-10 MG/5ML syrup 10 mL (has no administration in time range)  acetaminophen (TYLENOL) tablet 650 mg (650 mg Oral Given 04/20/20 1442)  polyethylene glycol (MIRALAX / GLYCOLAX) packet 17 g (has no administration in time range)  enoxaparin (LOVENOX) injection 75 mg (75 mg Subcutaneous Given 04/20/20 1424)  rosuvastatin  (CRESTOR) tablet 20 mg (has no administration in time range)  acetaminophen (TYLENOL) tablet 650 mg (650 mg Oral Given 04/20/20 0602)  casirivimab-imdevimab (REGEN-COV) 1,200 mg in sodium chloride 0.9 % 110 mL IVPB (0 mg Intravenous Stopped 04/20/20 1050)  ketorolac (TORADOL) 30 MG/ML injection 15 mg (15 mg Intravenous Given 04/20/20 1211)  chlorpheniramine-HYDROcodone (TUSSIONEX) 10-8 MG/5ML suspension 5 mL (5 mLs Oral Given 04/20/20 1211)  sodium chloride 0.9 % bolus 1,000 mL (1,000 mLs Intravenous New Bag/Given 04/20/20 1413)    ED Course  I have reviewed the triage vital signs and the nursing notes.  Pertinent labs & imaging results that were available during my care of the patient were reviewed by me and considered in my medical decision making (see chart for details).    MDM Rules/Calculators/A&P                          49 yo M with a chief complaints of cough congestion fever chills myalgia diarrhea going on for about a week now.  Found to have the coronavirus on testing.  Patient is obese, qualifies for the monoclonal antibody infusion.  Ordered first dose here.  Message sent to the monoclonal antibody team in our system.  He was ambulated here without hypoxia.  Joel West was evaluated in Emergency Department on 04/20/2020 for the symptoms described in the history of present illness. He/she was evaluated in the context of the global COVID-19 pandemic, which necessitated consideration that the patient might be at risk for infection with the SARS-CoV-2 virus that causes COVID-19. Institutional protocols and algorithms that pertain to the evaluation of patients at risk for COVID-19 are in a state of rapid change based on information released by regulatory bodies including the CDC and federal and state organizations. These policies and algorithms were followed during the patient's care in the ED.   As the patient was getting his monoclonal antibody infusion he became more tachypneic and  became hypoxic.  Oxygen down into the upper 80s.  Placed on oxygen with improvement.  Discussed with medicine for admission.  CRITICAL CARE Performed by: Quillian Quince  Levon Hedger   Total critical care time: 35 minutes  Critical care time was exclusive of separately billable procedures and treating other patients.  Critical care was necessary to treat or prevent imminent or life-threatening deterioration.  Critical care was time spent personally by me on the following activities: development of treatment plan with patient and/or surrogate as well as nursing, discussions with consultants, evaluation of patient's response to treatment, examination of patient, obtaining history from patient or surrogate, ordering and performing treatments and interventions, ordering and review of laboratory studies, ordering and review of radiographic studies, pulse oximetry and re-evaluation of patient's condition.  The patients results and plan were reviewed and discussed.   Any x-rays performed were independently reviewed by myself.   Differential diagnosis were considered with the presenting HPI.  Medications  0.9 %  sodium chloride infusion (has no administration in time range)  diphenhydrAMINE (BENADRYL) injection 50 mg (has no administration in time range)  famotidine (PEPCID) IVPB 20 mg premix (has no administration in time range)  methylPREDNISolone sodium succinate (SOLU-MEDROL) 125 mg/2 mL injection 125 mg (has no administration in time range)  albuterol (VENTOLIN HFA) 108 (90 Base) MCG/ACT inhaler 2 puff (has no administration in time range)  EPINEPHrine (EPI-PEN) injection 0.3 mg (has no administration in time range)  aspirin EC tablet 81 mg (81 mg Oral Given 04/20/20 1428)  dexamethasone (DECADRON) tablet 6 mg (6 mg Oral Given 04/20/20 1427)  remdesivir 200 mg in sodium chloride 0.9% 250 mL IVPB (200 mg Intravenous New Bag/Given 04/20/20 1420)    Followed by  remdesivir 100 mg in sodium chloride 0.9  % 100 mL IVPB (has no administration in time range)  guaiFENesin-dextromethorphan (ROBITUSSIN DM) 100-10 MG/5ML syrup 10 mL (has no administration in time range)  acetaminophen (TYLENOL) tablet 650 mg (650 mg Oral Given 04/20/20 1442)  polyethylene glycol (MIRALAX / GLYCOLAX) packet 17 g (has no administration in time range)  enoxaparin (LOVENOX) injection 75 mg (75 mg Subcutaneous Given 04/20/20 1424)  rosuvastatin (CRESTOR) tablet 20 mg (has no administration in time range)  acetaminophen (TYLENOL) tablet 650 mg (650 mg Oral Given 04/20/20 0602)  casirivimab-imdevimab (REGEN-COV) 1,200 mg in sodium chloride 0.9 % 110 mL IVPB (0 mg Intravenous Stopped 04/20/20 1050)  ketorolac (TORADOL) 30 MG/ML injection 15 mg (15 mg Intravenous Given 04/20/20 1211)  chlorpheniramine-HYDROcodone (TUSSIONEX) 10-8 MG/5ML suspension 5 mL (5 mLs Oral Given 04/20/20 1211)  sodium chloride 0.9 % bolus 1,000 mL (1,000 mLs Intravenous New Bag/Given 04/20/20 1413)    Vitals:   04/20/20 1228 04/20/20 1230 04/20/20 1300 04/20/20 1330  BP:  (!) 110/57 136/71 137/82  Pulse:  86 84 84  Resp:  (!) 35 (!) 31 (!) 29  Temp: (!) 102.7 F (39.3 C)     TempSrc: Oral     SpO2:  94% 95% 94%    Final diagnoses:  Shortness of breath  Acute respiratory failure with hypoxia (HCC)  Pneumonia due to COVID-19 virus    Admission/ observation were discussed with the admitting physician, patient and/or family and they are comfortable with the plan.   Final Clinical Impression(s) / ED Diagnoses Final diagnoses:  Shortness of breath  Acute respiratory failure with hypoxia ([place])  Pneumonia due to COVID-19 virus    Rx / DC Orders ED Discharge Orders    None       [name], DO 04/20/20 1522

## 2020-04-20 NOTE — ED Notes (Signed)
Pt asked this NT "why do my hands feel so hot?", temp retaken and triage RN notified.

## 2020-04-20 NOTE — ED Notes (Addendum)
Pt did well walking back in forth in room hr highest 108 when walking. Pt o2 was 95% when walking but soon after coughing pt o2 went to 91% then back to 94% when walking

## 2020-04-20 NOTE — ED Notes (Signed)
Pt reports he has been taking 1000mg  acetaminophen q3-4 hours for fever and body aches.

## 2020-04-20 NOTE — ED Notes (Signed)
Patient requesting note for work from provider.

## 2020-04-20 NOTE — H&P (Signed)
[facility] Admission History and Physical Service Pager: [phone]  Patient name: Joel West Medical record number: [id number] Date of birth: 11/17/1970 Age: 49 y.o. Gender: male  Primary Care Provider: Zenia Resides, MD Consultants: None Code Status: Full  Chief Complaint: Covid  Assessment and Plan: Joel West is a 49 y.o. male presenting with 1 week of cough, fever, malaise, diarrhea. PMH is significant for hyper HLD, prediabetes  Covid pneumonia Patient recently received his second Covid vaccination on 8/15.  Has had symptoms consistent with Covid infection including fever, cough, malaise and myalgia, diarrhea for approximately 1 week now.  Patient was found to be positive for Covid in the ED and was given monoclonal antibody infusion with the intent of discharging home, but patient developed worsening tachypnea and hypoxia.  Now requiring 2 L of oxygen.  Otherwise hemodynamically stable.  Will admit for to continued monitoring and treatment of Covid. -Admit to obs, MedSurg, Dr. Ardelia Mems attending. -Supplemental oxygen as needed. -Begin Decadron (8/27-) -Begin remdesivir (8/27-) -A.m. labs: CBC, CMP, ferritin, CRP -Follow-up chest x-ray -Vitals per routine  AKI Patient's baseline creatinine is around 1.  1.27 today.  Patient endorsed decreased appetite and decreased p.o. intake in the last week with increased diarrhea.   Likely prerenal. -1 L NS bolus -A.m. CMP  OSA Patient was diagnosed with severe obstructive sleep apnea in 2014.  He states he wears a CPAP at night. -Nightly CPAP  HLD Patient has a recent LDL value of 210.  PCP, Dr. Andria Frames, please patient on rosuvastatin.  This is not currently on his med list or problem list, but was mentioned in a note from February.  Appears that his statin was somehow removed from his med list at some point.  We will add back.  We will continue his statin therapy as patient endorses taking it at home  still. -Continue rosuvastatin  Prediabetes Hemoglobin A1c from February of this year showed a value of 5.8%.  Patient currently not on any medications.  Will expect his blood sugar to go up with addition of Decadron.  Glucose was 144 on admission. -Daily a.m. BMP. -Consider starting sliding scale insulin if it worsens  FEN/GI: Regular diet Prophylaxis: Lovenox  Disposition: MedSurg  History of Present Illness:  Joel West is a 49 y.o. male presenting with 1 week of Signs and Symptoms of COVID-19   Patient states that he developed symptoms starting last Friday that included fever, body ache, dizziness, dry throat, cough, diarrhea and abdominal pain.  He states that the cough, fever and malaise are still present.  He has chest pain with cough but otherwise no chest pain.  He got his second dose of Moderna vaccine on the 15th of this month.  Patient states that he also had hallucinations with his fever.  He states that he was watching a [place] movie and thought that he was actually in the movie.  Patient endorses obstructive sleep apnea and uses a CPAP at home.  He states that he is on rosuvastatin for hyperlipidemia.  He takes aspirin for primary prevention, but is not otherwise taking medications.  Drinks alcohol once a month.  No tobacco use.  No drug use.  W[place] lives in [place]  Traveled to [place] earlier this month and got back to [place] two weeks ago.   Review Of Systems: Per HPI with the following additions:   Review of Systems  Constitutional: Positive for chills, fever and malaise/fatigue.  HENT: Negative for sore throat.   Respiratory: Positive for cough and shortness of breath.   Cardiovascular: Positive for chest pain. Negative for palpitations.  Gastrointestinal: Positive for abdominal pain and diarrhea. Negative for nausea and vomiting.  Musculoskeletal: Positive for myalgias.  Neurological: Positive for dizziness.  Psychiatric/Behavioral:  Positive for hallucinations.    Patient Active Problem List   Diagnosis Date Noted  . Folliculitis 45/62/5638  . Pain of toe of right foot 02/16/2020  . Hx of adenomatous colonic polyps 10/14/2019  . Onychomycosis 09/26/2019  . Prostate cancer screening 09/26/2019  . Prediabetes 09/26/2019  . Encounter for health maintenance examination 12/24/2016  . Osteoarthritis, multiple sites 12/24/2016  . Lumbar back pain with radiculopathy affecting left lower extremity 02/16/2015  . Obstructive sleep apnea 10/22/2012  . HYPERCHOLESTEROLEMIA 10/22/2006  . Morbid obesity ([place]) 10/22/2006    Past Medical History: Past Medical History:  Diagnosis Date  . Anxiety   . Cellulitis 12/2013   RT LEG  . Headache(784.0)   . Hx of adenomatous colonic polyps 10/14/2019   09/2019 2 adenomas max 8 mm  . Hyperlipidemia   . Sleep apnea    USES CPAP    Past Surgical History: Past Surgical History:  Procedure Laterality Date  . NONE      Social History: Social History   Tobacco Use  . Smoking status: Never Smoker  . Smokeless tobacco: Never Used  Vaping Use  . Vaping Use: Never used  Substance Use Topics  . Alcohol use: Yes    Comment: 1 drink every 2-3 weeks  . Drug use: No   Additional social history:   Please also refer to relevant sections of EMR.  Family History: Family History  Problem Relation Age of Onset  . Hyperlipidemia Mother   . Diabetes Father   . Diabetes type I Sister   . Breast cancer Paternal Aunt   . Cancer Paternal Aunt        unknow type    Allergies and Medications: No Known Allergies No current facility-administered medications on file prior to encounter.   Current Outpatient Medications on File Prior to Encounter  Medication Sig Dispense Refill  . aspirin EC 81 MG tablet Take 1 tablet (81 mg total) by mouth daily.    Marland Kitchen ibuprofen (ADVIL) 800 MG tablet TAKE 1 TABLET BY MOUTH EVERY 8 HOURS AS NEEDED 100 tablet 0  . terbinafine (LAMISIL) 250 MG tablet  Take 1 tablet (250 mg total) by mouth daily. 90 tablet 0    Objective: BP 138/79   Pulse 87   Temp (!) 101.2 F (38.4 C) (Oral)   Resp (!) 32   SpO2 (!) 89%  Exam: General: Alert, oriented.  Laying in bed. Eyes: PERRLA.  EOMI. ENTM: Moist mucosa. Neck: No thyromegaly. Cardiovascular: Regular rate and rhythm.  No murmurs. Respiratory: Inspiratory crackles in the middle lung fields bilaterally.  On 2 L of oxygen satting 96%. Gastrointestinal: Distended, but soft abdomen.  Mildly diffusely tender to palpation.  Normal bowel sounds MSK: 5/5 strength upper and lower extremities bilaterally Derm: No rashes.  Skin warm and dry. Neuro: Cranial nerves II through XII grossly intact.  Sensation intact. Psych: Normal affect.  No hallucinations or delusions.  Labs and Imaging: CBC BMET  Recent Labs  Lab 04/19/20 2133  WBC 6.9  HGB 14.8  HCT 45.2  PLT 107*   Recent Labs  Lab 04/19/20 2133  NA 138  K 3.6  CL 102  CO2 23  BUN 10  CREATININE 1.27*  GLUCOSE 144*  CALCIUM 8.6Benay Pike, MD 04/20/2020, 11:26 AM PGY-3, [place] Intern pager: [phone], text pages welcome

## 2020-04-21 DIAGNOSIS — J9601 Acute respiratory failure with hypoxia: Secondary | ICD-10-CM | POA: Diagnosis not present

## 2020-04-21 DIAGNOSIS — R0602 Shortness of breath: Secondary | ICD-10-CM | POA: Diagnosis not present

## 2020-04-21 DIAGNOSIS — E119 Type 2 diabetes mellitus without complications: Secondary | ICD-10-CM | POA: Diagnosis not present

## 2020-04-21 DIAGNOSIS — U071 COVID-19: Principal | ICD-10-CM

## 2020-04-21 DIAGNOSIS — B351 Tinea unguium: Secondary | ICD-10-CM | POA: Diagnosis present

## 2020-04-21 DIAGNOSIS — Z86010 Personal history of colonic polyps: Secondary | ICD-10-CM | POA: Diagnosis not present

## 2020-04-21 DIAGNOSIS — Z803 Family history of malignant neoplasm of breast: Secondary | ICD-10-CM | POA: Diagnosis not present

## 2020-04-21 DIAGNOSIS — G4733 Obstructive sleep apnea (adult) (pediatric): Secondary | ICD-10-CM | POA: Diagnosis not present

## 2020-04-21 DIAGNOSIS — L739 Follicular disorder, unspecified: Secondary | ICD-10-CM | POA: Diagnosis present

## 2020-04-21 DIAGNOSIS — M79676 Pain in unspecified toe(s): Secondary | ICD-10-CM | POA: Diagnosis present

## 2020-04-21 DIAGNOSIS — E785 Hyperlipidemia, unspecified: Secondary | ICD-10-CM | POA: Diagnosis not present

## 2020-04-21 DIAGNOSIS — Z7982 Long term (current) use of aspirin: Secondary | ICD-10-CM | POA: Diagnosis not present

## 2020-04-21 DIAGNOSIS — J1282 Pneumonia due to coronavirus disease 2019: Secondary | ICD-10-CM | POA: Diagnosis not present

## 2020-04-21 DIAGNOSIS — M1990 Unspecified osteoarthritis, unspecified site: Secondary | ICD-10-CM | POA: Diagnosis present

## 2020-04-21 DIAGNOSIS — M5416 Radiculopathy, lumbar region: Secondary | ICD-10-CM | POA: Diagnosis present

## 2020-04-21 DIAGNOSIS — Z6841 Body Mass Index (BMI) 40.0 and over, adult: Secondary | ICD-10-CM | POA: Diagnosis not present

## 2020-04-21 DIAGNOSIS — F419 Anxiety disorder, unspecified: Secondary | ICD-10-CM | POA: Diagnosis not present

## 2020-04-21 DIAGNOSIS — Z833 Family history of diabetes mellitus: Secondary | ICD-10-CM | POA: Diagnosis not present

## 2020-04-21 DIAGNOSIS — N179 Acute kidney failure, unspecified: Secondary | ICD-10-CM | POA: Diagnosis not present

## 2020-04-21 DIAGNOSIS — Z9981 Dependence on supplemental oxygen: Secondary | ICD-10-CM | POA: Diagnosis not present

## 2020-04-21 DIAGNOSIS — Z83438 Family history of other disorder of lipoprotein metabolism and other lipidemia: Secondary | ICD-10-CM | POA: Diagnosis not present

## 2020-04-21 DIAGNOSIS — E7800 Pure hypercholesterolemia, unspecified: Secondary | ICD-10-CM | POA: Diagnosis present

## 2020-04-21 DIAGNOSIS — J9811 Atelectasis: Secondary | ICD-10-CM | POA: Diagnosis not present

## 2020-04-21 DIAGNOSIS — Z79899 Other long term (current) drug therapy: Secondary | ICD-10-CM | POA: Diagnosis not present

## 2020-04-21 DIAGNOSIS — R197 Diarrhea, unspecified: Secondary | ICD-10-CM | POA: Diagnosis not present

## 2020-04-21 LAB — COMPREHENSIVE METABOLIC PANEL
ALT: 64 U/L — ABNORMAL HIGH (ref 0–44)
AST: 106 U/L — ABNORMAL HIGH (ref 15–41)
Albumin: 3.2 g/dL — ABNORMAL LOW (ref 3.5–5.0)
Alkaline Phosphatase: 55 U/L (ref 38–126)
Anion gap: 10 (ref 5–15)
BUN: 9 mg/dL (ref 6–20)
CO2: 25 mmol/L (ref 22–32)
Calcium: 8.3 mg/dL — ABNORMAL LOW (ref 8.9–10.3)
Chloride: 103 mmol/L (ref 98–111)
Creatinine, Ser: 1.04 mg/dL (ref 0.61–1.24)
GFR calc Af Amer: >60 mL/min (ref >60)
GFR calc non Af Amer: >60 mL/min (ref >60)
Glucose, Bld: 165 mg/dL — ABNORMAL HIGH (ref 70–99)
Potassium: 3.5 mmol/L (ref 3.5–5.1)
Sodium: 138 mmol/L (ref 135–145)
Total Bilirubin: 1 mg/dL (ref 0.3–1.2)
Total Protein: 7.7 g/dL (ref 6.5–8.1)

## 2020-04-21 LAB — CBC WITH DIFFERENTIAL/PLATELET
Abs Immature Granulocytes: 0.03 10*3/uL (ref 0.00–0.07)
Basophils Absolute: 0 10*3/uL (ref 0.0–0.1)
Basophils Relative: 0 %
Eosinophils Absolute: 0 10*3/uL (ref 0.0–0.5)
Eosinophils Relative: 0 %
HCT: 42.3 % (ref 39.0–52.0)
Hemoglobin: 13.9 g/dL (ref 13.0–17.0)
Immature Granulocytes: 0 %
Lymphocytes Relative: 13 %
Lymphs Abs: 0.9 10*3/uL (ref 0.7–4.0)
MCH: 29.5 pg (ref 26.0–34.0)
MCHC: 32.9 g/dL (ref 30.0–36.0)
MCV: 89.8 fL (ref 80.0–100.0)
Monocytes Absolute: 0.3 10*3/uL (ref 0.1–1.0)
Monocytes Relative: 4 %
Neutro Abs: 5.8 10*3/uL (ref 1.7–7.7)
Neutrophils Relative %: 83 %
Platelets: 102 10*3/uL — ABNORMAL LOW (ref 150–400)
RBC: 4.71 MIL/uL (ref 4.22–5.81)
RDW: 13 % (ref 11.5–15.5)
WBC: 7 10*3/uL (ref 4.0–10.5)
nRBC: 0 % (ref 0.0–0.2)

## 2020-04-21 LAB — TROPONIN I (HIGH SENSITIVITY)
Troponin I (High Sensitivity): 17 ng/L (ref <18)
Troponin I (High Sensitivity): 13 ng/L (ref <18)

## 2020-04-21 LAB — C-REACTIVE PROTEIN: CRP: 19 mg/dL — ABNORMAL HIGH (ref <1.0)

## 2020-04-21 LAB — HIV ANTIBODY (ROUTINE TESTING W REFLEX): HIV Screen 4th Generation wRfx: NONREACTIVE

## 2020-04-21 LAB — FERRITIN: Ferritin: 1268 ng/mL — ABNORMAL HIGH (ref 24–336)

## 2020-04-21 MED ORDER — ENSURE ENLIVE PO LIQD
237.0000 mL | Freq: Two times a day (BID) | ORAL | Status: DC
  Administered 2020-04-21 – 2020-04-23 (×4): 237 mL via ORAL

## 2020-04-21 NOTE — Evaluation (Addendum)
Physical Therapy Evaluation Patient Details Name: Joel West MRN: [id number] DOB: 11-30-1970 Today's Date: 04/21/2020   History of Present Illness  Pt is a 49 y.o. M with significant PMH of obesity, OSA, prediabetes who presents with acute hypoxic respiratory failure in setting of Covid PNA.   Clinical Impression  Patient evaluated by Physical Therapy with no further acute PT needs identified. 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 He has assist from his sister if needed. Upon entry, pt SpO2 94% on RA, 90% seated edge of bed, and ranged from 88-89% consistently with mobility; HR stable 90 bpm. Pt denying dizziness or dyspnea on at rest or with exertion and orthostatics negative. Pt ambulating hallway distances independently without an assistive device. Education provided regarding ambulating 3x/day during inpatient stay, incentive spirometer use, and endurance conservation strategies. All education has been completed and the patient has no further questions. No follow-up Physical Therapy or equipment needs. PT is signing off. Thank you for this referral.    Follow Up Recommendations No PT follow up    Equipment Recommendations  None recommended by PT    Recommendations for Other Services       Precautions / Restrictions Precautions Precautions: None Restrictions Weight Bearing Restrictions: No      Mobility  Bed Mobility Overal bed mobility: Modified Independent                Transfers Overall transfer level: Independent Equipment used: None                Ambulation/Gait Ambulation/Gait assistance: Independent Gait Distance (Feet): 200 Feet Assistive device: None Gait Pattern/deviations: WFL(Within Functional Limits)     General Gait Details: No gross imbalance  Stairs            Wheelchair Mobility    Modified Rankin (Stroke Patients Only)       Balance Overall balance assessment: No apparent balance deficits  (not formally assessed)                                           Pertinent Vitals/Pain Pain Assessment: No/denies pain    Home Living Family/patient expects to be discharged to:: Private residence Living Arrangements: Alone Available Help at Discharge: Family;Available PRN/intermittently (sister) Type of Home: House Home Access: Stairs to enter   [employer] of Steps: 1 Home Layout: One level Home Equipment: None      Prior Function Level of Independence: Independent         Comments: Drive Printmaker        Extremity/Trunk Assessment   Upper Extremity Assessment Upper Extremity Assessment: Overall WFL for tasks assessed    Lower Extremity Assessment Lower Extremity Assessment: Overall WFL for tasks assessed    Cervical / Trunk Assessment Cervical / Trunk Assessment: Other exceptions Cervical / Trunk Exceptions: increased body habitus  Communication   Communication: No difficulties  Cognition Arousal/Alertness: Awake/alert Behavior During Therapy: WFL for tasks assessed/performed Overall Cognitive Status: Within Functional Limits for tasks assessed                                        General Comments      Exercises     Assessment/Plan    PT Assessment  Patent does not need any further PT services  PT Problem List Decreased strength;Decreased mobility;Cardiopulmonary status limiting activity       PT Treatment Interventions      PT Goals (Current goals can be found in the Care Plan section)  Acute Rehab PT Goals Patient Stated Goal: did not state PT Goal Formulation: All assessment and education complete, DC therapy    Frequency     Barriers to discharge        Co-evaluation               AM-PAC PT "6 Clicks" Mobility  Outcome Measure Help needed turning from your back to your side while in a flat bed without using bedrails?: None Help needed moving from lying on  your back to sitting on the side of a flat bed without using bedrails?: None Help needed moving to and from a bed to a chair (including a wheelchair)?: None Help needed standing up from a chair using your arms (e.g., wheelchair or bedside chair)?: None Help needed to walk in hospital room?: None Help needed climbing 3-5 steps with a railing? : A Little 6 Click Score: 23    End of Session   Activity Tolerance: Patient tolerated treatment well Patient left: in bed;with call bell/phone within reach Nurse Communication: Mobility status PT Visit Diagnosis: Difficulty in walking, not elsewhere classified (R26.2)    Time: 3335-4562 PT Time Calculation (min) (ACUTE ONLY): 30 min   Charges:   PT Evaluation $PT Eval Low Complexity: 1 Low PT Treatments $Therapeutic Activity: 8-22 mins          [name], PT, DPT Acute Rehabilitation Services Pager [phone] Office [phone]   [name] 04/21/2020, 1:08 PM

## 2020-04-21 NOTE — Plan of Care (Signed)
  Problem: Education: Goal: Knowledge of risk factors and measures for prevention of condition will improve Outcome: Progressing   Problem: Respiratory: Goal: Will maintain a patent airway Outcome: Progressing   Problem: Education: Goal: Knowledge of General Education information will improve Description: Including pain rating scale, medication(s)/side effects and non-pharmacologic comfort measures Outcome: Progressing   Problem: Health Behavior/Discharge Planning: Goal: Ability to manage health-related needs will improve Outcome: Progressing   Problem: Clinical Measurements: Goal: Respiratory complications will improve Outcome: Progressing   Problem: Nutrition: Goal: Adequate nutrition will be maintained Outcome: Progressing

## 2020-04-21 NOTE — Progress Notes (Signed)
Pt arrived from the ED to Room 05 accompanied by ED tech on stretcher; pt was able to ambulate from stretcher to bed w/ standby assistance and no difficulties. Pt received on 2L O2 via [place] and pt was placed on same device; vitals taken at admission: BP 127/77 (BP Location: Left Arm)    Pulse 84    Temp 99.1 F (37.3 C) (Oral)    Resp 19    Ht 5\' 11"  (1.803 m)    Wt (!) 136.7 kg    SpO2 92%    BMI 42.03 kg/m   Pt had no c/o pain or any other signs and symptoms; pt is alert and oriented x4 and was introduced and familiarized to room layout and devices. Pt was provided w/ ice water and blankets per pt request. Will continue to monitor.

## 2020-04-21 NOTE — Progress Notes (Signed)
Family Medicine Teaching Service Daily Progress Note Intern Pager: [phone]  Patient name: Joel West Medical record number: [id number] Date of birth: 12-11-1970 Age: 49 y.o. Gender: male  Primary Care Provider: Zenia Resides, MD Consultants: None  Code Status: Full Code   Pt Overview and Major Events to Date:  8/27: admitted, COVID +, started on Remdesivir and Decadron, on 2 L [place]   Assessment and Plan:  Joel West is a 49 y.o. male presenting with 1 week of cough, fever, malaise, diarrhea. PMH is significant for hyper HLD, prediabetes  Acute Hypoxic Respiratory Failure in setting of Covid PNA Patient reports that he continues to not feel well, continues to cough with deep inspiration.  Lung exam notable for decreased air movement throughout bilateral lung fields however did not note any wheezing, rare fine crackles at the bases.  Patient continues to be on 2 L saturating 93%.Patient reports some dizziness with ambulation. CRP elevated at 19, ferritin 1298 today.  - continue supplemental oxygen as needed. - continue Decadron day 2/10 - continue remdesivir day 2/5 - A.m. labs: CBC, CMP, ferritin, CRP - Follow-up chest x-ray - Vitals per routine - PT/OT    Concern for NSTEMI  EKG with evidence of left ventricular hypertrophy and ST changes in leads V2 and V3 on morning EKG that are different than patient's initial EKG on admission.  Patient denies chest pain but does report some tightness when he sneezes but is specific that this is not painful for him.  Patient denies any pain radiating into his jaw or neck.  Patient denies GI upset but reports some diarrhea earlier this week.  No tenderness to palpation of precordial areas. Troponin 17 initially. -We will trend troponin to confirm it is downtrending -Repeat EKG -Monitor patient for development of chest pain  AKI Patient's baseline creatinine is around 1. Cr elevated on admission to 1.27, thought to be prerenal as Cr  improved overnight after fluid boluses in ED. Cr today decreased to 1.04.  - continue to monitor with daily BMP  OSA Patient was diagnosed with severe obstructive sleep apnea in 2014.  He states he wears a CPAP at night. -With Covid diagnosis, patient will likely not be able to use CPAP while inpatient  HLD Patient with elevated total cholesterol and and LDL of 269 and 210 respectively. Will continue statin therapy.  - continue rosuvastatin  Prediabetes Last hgb A1c 5.8 in Feb of 2021. Glucose slightly elevated on admission, today glucose is 165 - monitor BG - hgb A1c   FEN/GI: Regular diet Prophylaxis: Lovenox  Disposition: dc expected in next 1-2 days   Subjective:  Patient reports that he feels about the same.  He continues to have shortness of breath with exerting himself including any trips to the restroom.  Patient reports that he feels a little woozy when he tries to walk.  Counseled patient to not get up without assistance in the room to prevent syncopal episodes.  Patient verbalized understanding.  Patient denies chest pain or any pain radiating to his neck or jaw.  States that he does have some chest tightness when he sneezes but that quickly resolves after sneezing.  He reports that he was sneezing a lot.  Patient denies any hemoptysis and reports continued dry cough.  Objective: Temp:  [98.4 F (36.9 C)-102.7 F (39.3 C)] 99.2 F (37.3 C) (08/28 0438) Pulse Rate:  [75-93] 75 (08/28 0438) Resp:  [19-39] 20 (08/28 0438) BP: (110-143)/(57-83) 134/82 (08/28 0438) SpO2:  [  89 %-99 %] 91 % (08/28 0438) Weight:  [136.7 kg] 136.7 kg (08/28 0059)   Physical Exam: General: Obese male lying in bed in no acute distress watching television Cardiovascular: Regular rate and rhythm without murmurs appreciated, bilateral radial pulses palpable Respiratory: Decreased lung sounds bilaterally do not note any wheezing, rare fine crackles at bases Abdomen: Obese abdomen, nontender to  palpation with normal bowel sounds throughout Extremities: No lower extremity edema  Laboratory: Recent Labs  Lab 04/19/20 2133 04/21/20 0142  WBC 6.9 7.0  HGB 14.8 13.9  HCT 45.2 42.3  PLT 107* 102*   Recent Labs  Lab 04/19/20 2133 04/21/20 0142  NA 138 138  K 3.6 3.5  CL 102 103  CO2 23 25  BUN 10 9  CREATININE 1.27* 1.04  CALCIUM 8.6* 8.3*  PROT 8.2* 7.7  BILITOT 0.8 1.0  ALKPHOS 60 55  ALT 56* 64*  AST 103* 106*  GLUCOSE 144* 165*   Imaging/Diagnostic Tests: Portable chest 1 View  Result Date: 04/20/2020 CLINICAL DATA:  Shortness of breath, fever. EXAM: PORTABLE CHEST 1 VIEW COMPARISON:  None. FINDINGS: The heart size and mediastinal contours are within normal limits. No pneumothorax pleural effusion is noted. Mild bibasilar opacities are noted concerning for subsegmental atelectasis or infiltrates. The visualized skeletal structures are unremarkable. IMPRESSION: Mild bibasilar subsegmental atelectasis or infiltrates are noted. Electronically Signed   By: Marijo Conception M.D.   On: 04/20/2020 12:46    Eulis Foster, MD 04/21/2020, 7:17 AM PGY-2, [place] Intern pager: [phone], text pages welcome

## 2020-04-21 NOTE — Progress Notes (Signed)
Patient refused use of CPAP, RT will continue to monitor.

## 2020-04-21 NOTE — Progress Notes (Signed)
SATURATION QUALIFICATIONS: (This note is used to comply with regulatory documentation for home oxygen)  Patient Saturations on Room Air at Rest = 94%  Patient Saturations on Room Air while Ambulating = 88%  Patient Saturations on 2 Liters of oxygen while Ambulating = 92%  Please briefly explain why patient needs home oxygen: pt does not qualify for home O2. Patient maintains saturations greater than 88% while on room air.

## 2020-04-22 LAB — CBC WITH DIFFERENTIAL/PLATELET
Abs Immature Granulocytes: 0.04 10*3/uL (ref 0.00–0.07)
Basophils Absolute: 0 10*3/uL (ref 0.0–0.1)
Basophils Relative: 0 %
Eosinophils Absolute: 0 10*3/uL (ref 0.0–0.5)
Eosinophils Relative: 0 %
HCT: 42.4 % (ref 39.0–52.0)
Hemoglobin: 13.6 g/dL (ref 13.0–17.0)
Immature Granulocytes: 1 %
Lymphocytes Relative: 12 %
Lymphs Abs: 0.9 10*3/uL (ref 0.7–4.0)
MCH: 28.8 pg (ref 26.0–34.0)
MCHC: 32.1 g/dL (ref 30.0–36.0)
MCV: 89.8 fL (ref 80.0–100.0)
Monocytes Absolute: 0.4 10*3/uL (ref 0.1–1.0)
Monocytes Relative: 5 %
Neutro Abs: 5.8 10*3/uL (ref 1.7–7.7)
Neutrophils Relative %: 82 %
Platelets: 135 10*3/uL — ABNORMAL LOW (ref 150–400)
RBC: 4.72 MIL/uL (ref 4.22–5.81)
RDW: 13.2 % (ref 11.5–15.5)
WBC: 7.2 10*3/uL (ref 4.0–10.5)
nRBC: 0.3 % — ABNORMAL HIGH (ref 0.0–0.2)

## 2020-04-22 LAB — COMPREHENSIVE METABOLIC PANEL
ALT: 72 U/L — ABNORMAL HIGH (ref 0–44)
AST: 88 U/L — ABNORMAL HIGH (ref 15–41)
Albumin: 3.1 g/dL — ABNORMAL LOW (ref 3.5–5.0)
Alkaline Phosphatase: 48 U/L (ref 38–126)
Anion gap: 11 (ref 5–15)
BUN: 15 mg/dL (ref 6–20)
CO2: 25 mmol/L (ref 22–32)
Calcium: 8.6 mg/dL — ABNORMAL LOW (ref 8.9–10.3)
Chloride: 104 mmol/L (ref 98–111)
Creatinine, Ser: 1.03 mg/dL (ref 0.61–1.24)
GFR calc Af Amer: >60 mL/min (ref >60)
GFR calc non Af Amer: >60 mL/min (ref >60)
Glucose, Bld: 185 mg/dL — ABNORMAL HIGH (ref 70–99)
Potassium: 3.6 mmol/L (ref 3.5–5.1)
Sodium: 140 mmol/L (ref 135–145)
Total Bilirubin: 0.8 mg/dL (ref 0.3–1.2)
Total Protein: 7.4 g/dL (ref 6.5–8.1)

## 2020-04-22 LAB — HEMOGLOBIN A1C
Hgb A1c MFr Bld: 6.5 % — ABNORMAL HIGH (ref 4.8–5.6)
Mean Plasma Glucose: 139.85 mg/dL

## 2020-04-22 LAB — FERRITIN: Ferritin: 1234 ng/mL — ABNORMAL HIGH (ref 24–336)

## 2020-04-22 LAB — C-REACTIVE PROTEIN: CRP: 14 mg/dL — ABNORMAL HIGH (ref <1.0)

## 2020-04-22 NOTE — Progress Notes (Signed)
Patient currently sating 88-89% on room air while in chair. Lunch tray provided. Will obtain walking o2's after lunch.

## 2020-04-22 NOTE — Plan of Care (Signed)
  Problem: Education: Goal: Knowledge of risk factors and measures for prevention of condition will improve Outcome: Progressing   Problem: Respiratory: Goal: Will maintain a patent airway Outcome: Progressing   Problem: Education: Goal: Knowledge of General Education information will improve Description: Including pain rating scale, medication(s)/side effects and non-pharmacologic comfort measures Outcome: Progressing   Problem: Health Behavior/Discharge Planning: Goal: Ability to manage health-related needs will improve Outcome: Progressing   Problem: Clinical Measurements: Goal: Respiratory complications will improve Outcome: Progressing   Problem: Nutrition: Goal: Adequate nutrition will be maintained Outcome: Progressing

## 2020-04-22 NOTE — Progress Notes (Signed)
Initial Nutrition Assessment  DOCUMENTATION CODES:   Obesity unspecified  INTERVENTION:    Ensure Enlive po BID, each supplement provides 350 kcal and 20 grams of protein  MVI daily   NUTRITION DIAGNOSIS:   Increased nutrient needs related to acute illness as evidenced by estimated needs.  GOAL:   Patient will meet greater than or equal to 90% of their needs  MONITOR:   PO intake, Supplement acceptance, Weight trends, Labs, I & O's  REASON FOR ASSESSMENT:   Malnutrition Screening Tool    ASSESSMENT:   Patient with PMH significant for HTN. Presents this admission with COVID PNA.   Unable to reach pt via phone. No meal completions documented. Had one Ensure today. Will continue with supplementation to maximize kcal and protein this admission.   Records indicate pt weighed 148.8 kg on 6/24 and 136.7 kg this admission. Per records weight shows to fluctuate. Will need to confirm recent weight loss. Will attempt to obtain more history if possible.   Medications: decadron Labs: CBG 165-185  Diet Order:   Diet Order            Diet regular Room service appropriate? Yes; Fluid consistency: Thin  Diet effective now                 EDUCATION NEEDS:   Not appropriate for education at this time  Skin:  Skin Assessment: Reviewed RN Assessment  Last BM:  8/26  Height:   Ht Readings from Last 1 Encounters:  04/21/20 5\' 11"  (1.803 m)    Weight:   Wt Readings from Last 1 Encounters:  04/21/20 (!) 136.7 kg    BMI:  Body mass index is 42.03 kg/m.  Estimated Nutritional Needs:   Kcal:  2300-2500 kcal  Protein:  115-130 grams  Fluid:  >/= 2.3 L/day   Mariana Single RD, LDN Clinical Nutrition Pager listed in [place]

## 2020-04-22 NOTE — Hospital Course (Addendum)
Joel West is a 49 y.o. male with a history of pre-diabetes and HLD who presented with acute hypoxemic respiratory failure secondary to COVID-19 infection. Hospital course outlined by problem below:  Acute hypoxemic respiratory failure secondary to COVID-19 infection Patient presented with approximately 1 week of fever, cough, malaise, and diarrhea, 4 days after receiving the second dose of Covid vaccine.  Initially presented with tachypnea and hypoxemia requiring 2L [place].  CXR revealed mild bibasilar subsegmental atelectasis vs infiltrates.  He was treated with remdesivir and dexamethasone.  By the day of discharge, he was maintaining good SpO2 on room air.***He was discharged on dexamethasone to complete a 10-day course.***  Remdesivir (8/27-***) Dexamethasone (8/27-***)  AKI Patient initially presented with elevated serum creatinine of 1.27.  Resolved with IV fluids.***Serum creatinine prior to discharge was ***.  Other problems chronic and stable.

## 2020-04-22 NOTE — Progress Notes (Signed)
Patient ambulated from bed to door but is refusing further mobility. Patient states he will walk further tomorrow.   Will ambulating patient's saturations dropped to 86% on room air. Currently sating 89% on room air at rest. Will continue to monitor.   Hiram Comber, RN 04/22/2020 3:48 PM

## 2020-04-22 NOTE — Progress Notes (Signed)
SATURATION QUALIFICATIONS: (This note is used to comply with regulatory documentation for home oxygen)  Patient Saturations on Room Air at Rest = 89%  Patient Saturations on Room Air while Ambulating = 86%  Patient Saturations on 2 Liters of oxygen while Ambulating = 93%

## 2020-04-22 NOTE — Progress Notes (Signed)
Family Medicine Teaching Service Daily Progress Note Intern Pager: [phone]  Patient name: Joel West Medical record number: [id number] Date of birth: 04-23-1971 Age: 49 y.o. Gender: male  Primary Care Provider: Zenia Resides, MD Consultants: None  Code Status: Full Code   Pt Overview and Major Events to Date:  8/27: admitted, COVID+, started on Remdesivir and Decadron, on 2 L [place]  8/28: No longer requiring oxygen at rest 8/29: Patient D satting to 88-90% on room air without increased work of breathing  Assessment and Plan: DAMONTRE MILLEA is a 49 y.o. male presenting with 1 week of cough, fever, malaise, diarrhea. PMH is significant for hyper HLD, prediabetes  Acute Hypoxic Respiratory Failure d/t Covid PNA, improving Patient satting 91% this morning on room air, vital signs stable, remains afebrile. Continues to have decreased air movement in bilateral lungs with crackles appreciated on auscultation. CRP improving 19 down to 14, ferritin unchanged 1298 now 1234. Chest Xray 8/27 showed mild bibasilar subsegmental atelectasis/infiltrates. -Supplemental oxygen as needed - patient currently satting 88-90% on room air, desats to 84% with coughing spell. Plan to reambulate with pulse ox.. -Decadron day #3/10 (8/27-9/5) -Remdesivir day #3/5 (8/27-8/31) -A.m. labs: CBC, CMP, ferritin, CRP -Vitals per routine -PT/OT - No PT follow up  OSA, chronic, stable Patient was diagnosed with severe OSA in 2014.  Reports he wears a CPAP at night. Declined CPAP last night 8/28-29. -Offer CPAP, may not be able to use due to COVID status  HLD Patient with elevated total cholesterol and LDL of 269 and 210 respectively. Will continue statin therapy. ASCVD risk 5.9%. -Continue rosuvastatin 20mg  daily -ASA 81  Diabetes Last hgb A1c 5.8 in Feb of 2021, 6.5% today. Glucose slightly elevated on admission, glucose 185 overnight.  Patient also currently receiving steroids for Covid. -monitor  CBG -Initiate Metformin on discharge  Concern for NSTEMI, resolved EKG with evidence of LVH and ST changes in leads V2 and V3 on morning EKG, different from initial EKG. Patient denies chest pain but does report some tightness when he sneezes but is specific that this is not painful for him.  Patient denies any pain radiating into his jaw or neck.  No tenderness to palpation of precordial areas. Troponin 17>13. -Repeat EKG 8/29 -Monitor patient for development of chest pain  AKI, prerenal, resolved Cr 1.27 >1.03. Patient's BL Cr ~1. Thought to be prerenal due to improvement with IV fluids. - continue to monitor with daily BMP  FEN/GI: Regular diet Prophylaxis: Lovenox 75mg  daily  Disposition: Most likely d/c home today 8/29  Subjective:  Patient resting comfortably in bed eating his breakfast. Has occasional productive cough. Reports he still feels as if there is lots of mucus in his chest.  Objective: Temp:  [98.2 F (36.8 C)-98.8 F (37.1 C)] 98.2 F (36.8 C) (08/29 0531) Pulse Rate:  [67-76] 67 (08/29 0727) Resp:  [18-20] 18 (08/29 0531) BP: (107-126)/(79-85) 126/85 (08/29 0531) SpO2:  [90 %-94 %] 91 % (08/29 0727)   Physical Exam: General: Very pleasant patient, resting comfortably in bed, no apparent distress, nontoxic-appearing Cardiovascular: RRR, S1-S2 present, no murmurs appreciated Respiratory: Decreased lung sounds, crackles appreciated to bilateral lung bases  Abdomen: Normal bowel sounds throughout Extremities: No lower extremity edema  Laboratory: Recent Labs  Lab 04/19/20 2133 04/21/20 0142 04/22/20 0256  WBC 6.9 7.0 7.2  HGB 14.8 13.9 13.6  HCT 45.2 42.3 42.4  PLT 107* 102* 135*   Recent Labs  Lab 04/19/20 2133 04/21/20 0142 04/22/20 0256  NA 138 138 140  K 3.6 3.5 3.6  CL 102 103 104  CO2 23 25 25   BUN 10 9 15   CREATININE 1.27* 1.04 1.03  CALCIUM 8.6* 8.3* 8.6*  PROT 8.2* 7.7 7.4  BILITOT 0.8 1.0 0.8  ALKPHOS 60 55 48  ALT 56* 64* 72*   AST 103* 106* 88*  GLUCOSE 144* 165* 185*   EKG: Follow-up repeat EKG 8/29  Imaging/Diagnostic Tests: No results found.   Daisy Floro, DO 04/22/2020, 8:30 AM PGY-3, [place] Intern pager: [phone], text pages welcome

## 2020-04-23 LAB — COMPREHENSIVE METABOLIC PANEL
ALT: 64 U/L — ABNORMAL HIGH (ref 0–44)
AST: 53 U/L — ABNORMAL HIGH (ref 15–41)
Albumin: 3.1 g/dL — ABNORMAL LOW (ref 3.5–5.0)
Alkaline Phosphatase: 52 U/L (ref 38–126)
Anion gap: 11 (ref 5–15)
BUN: 16 mg/dL (ref 6–20)
CO2: 24 mmol/L (ref 22–32)
Calcium: 8.7 mg/dL — ABNORMAL LOW (ref 8.9–10.3)
Chloride: 104 mmol/L (ref 98–111)
Creatinine, Ser: 1.02 mg/dL (ref 0.61–1.24)
GFR calc Af Amer: >60 mL/min (ref >60)
GFR calc non Af Amer: >60 mL/min (ref >60)
Glucose, Bld: 184 mg/dL — ABNORMAL HIGH (ref 70–99)
Potassium: 3.5 mmol/L (ref 3.5–5.1)
Sodium: 139 mmol/L (ref 135–145)
Total Bilirubin: 0.7 mg/dL (ref 0.3–1.2)
Total Protein: 7.2 g/dL (ref 6.5–8.1)

## 2020-04-23 LAB — CBC WITH DIFFERENTIAL/PLATELET
Abs Immature Granulocytes: 0.16 10*3/uL — ABNORMAL HIGH (ref 0.00–0.07)
Basophils Absolute: 0 10*3/uL (ref 0.0–0.1)
Basophils Relative: 0 %
Eosinophils Absolute: 0 10*3/uL (ref 0.0–0.5)
Eosinophils Relative: 0 %
HCT: 42.2 % (ref 39.0–52.0)
Hemoglobin: 13.9 g/dL (ref 13.0–17.0)
Immature Granulocytes: 2 %
Lymphocytes Relative: 13 %
Lymphs Abs: 1.1 10*3/uL (ref 0.7–4.0)
MCH: 29.8 pg (ref 26.0–34.0)
MCHC: 32.9 g/dL (ref 30.0–36.0)
MCV: 90.6 fL (ref 80.0–100.0)
Monocytes Absolute: 0.7 10*3/uL (ref 0.1–1.0)
Monocytes Relative: 8 %
Neutro Abs: 6.7 10*3/uL (ref 1.7–7.7)
Neutrophils Relative %: 77 %
Platelets: 167 10*3/uL (ref 150–400)
RBC: 4.66 MIL/uL (ref 4.22–5.81)
RDW: 13.2 % (ref 11.5–15.5)
WBC: 8.7 10*3/uL (ref 4.0–10.5)
nRBC: 0 % (ref 0.0–0.2)

## 2020-04-23 LAB — C-REACTIVE PROTEIN: CRP: 8 mg/dL — ABNORMAL HIGH (ref <1.0)

## 2020-04-23 LAB — FERRITIN: Ferritin: 982 ng/mL — ABNORMAL HIGH (ref 24–336)

## 2020-04-23 MED ORDER — ACETAMINOPHEN 325 MG PO TABS: 650.0000 mg | ORAL_TABLET | Freq: Four times a day (QID) | ORAL | 0 refills | Status: AC | PRN

## 2020-04-23 NOTE — Evaluation (Signed)
Occupational Therapy Evaluation Patient Details Name: Joel West MRN: [id number] DOB: 1971/07/26 Today's Date: 04/23/2020    History of Present Illness Pt is a 49 y.o. M with significant PMH of obesity, OSA, prediabetes who presents with acute hypoxic respiratory failure in setting of Covid PNA.    Clinical Impression   This 49 y/o male presents with the above. PTA pt reports being independent with ADL and functional mobility. Pt currently performing ADL and room/hallway level mobility without AD at supervision - mod independent level throughout. Pt tolerating activity well today with only mild dyspnea noted with hallway mobility. Pt on RA with O2 measured via probe on earlobe - difficult to maintain good pleth throughout all activity but when able to get reading SpO2 >/=89%. Pt to benefit from continued acute OT services to further address energy conservation and to maximize his overall safety and independence with ADL and mobility; do not anticipate pt will require follow up OT services after discharge.     Follow Up Recommendations  No OT follow up;Supervision - Intermittent    Equipment Recommendations  None recommended by OT           Precautions / Restrictions Precautions Precautions: None Restrictions Weight Bearing Restrictions: No      Mobility Bed Mobility Overal bed mobility: Modified Independent                Transfers Overall transfer level: Independent Equipment used: None                  Balance Overall balance assessment: No apparent balance deficits (not formally assessed)                                         ADL either performed or assessed with clinical judgement   ADL Overall ADL's : Needs assistance/impaired Eating/Feeding: Modified independent;Sitting   Grooming: Supervision/safety;Standing   Upper Body Bathing: Modified independent;Sitting   Lower Body Bathing: Supervison/ safety;Sit to/from stand   Upper  Body Dressing : Modified independent;Sitting   Lower Body Dressing: Supervision/safety;Sit to/from stand   Toilet Transfer: Modified Independent;Ambulation;Regular Toilet   Toileting- Water quality scientist and Hygiene: Supervision/safety;Sit to/from stand       Functional mobility during ADLs: Supervision/safety (progressed to mod independent )                    Pertinent Vitals/Pain Pain Assessment: No/denies pain     Hand Dominance     Extremity/Trunk Assessment Upper Extremity Assessment Upper Extremity Assessment: Overall WFL for tasks assessed   Lower Extremity Assessment Lower Extremity Assessment: Defer to PT evaluation;Overall [facility] for tasks assessed   Cervical / Trunk Assessment Cervical / Trunk Assessment: Other exceptions Cervical / Trunk Exceptions: increased body habitus   Communication Communication Communication: No difficulties   Cognition Arousal/Alertness: Awake/alert Behavior During Therapy: WFL for tasks assessed/performed Overall Cognitive Status: Within Functional Limits for tasks assessed                                     General Comments       Exercises     Shoulder Instructions      Home Living Family/patient expects to be discharged to:: Private residence Living Arrangements: Alone Available Help at Discharge: Family;Available PRN/intermittently (sister) Type of Home: House Home Access: Stairs to  enter Entrance Stairs-Number of Steps: 1   Home Layout: One level     Bathroom Shower/Tub: Teacher, early years/pre: Standard     Home Equipment: None          Prior Functioning/Environment Level of Independence: Independent        Comments: Drive forklift        OT Problem List: Decreased activity tolerance;Cardiopulmonary status limiting activity;Obesity      OT Treatment/Interventions: Self-care/ADL training;Therapeutic exercise;Energy conservation;DME and/or AE instruction;Therapeutic  activities;Patient/family education;Balance training    OT Goals(Current goals can be found in the care plan section) Acute Rehab OT Goals Patient Stated Goal: hopeful for home today OT Goal Formulation: With patient Time For Goal Achievement: 05/07/20 Potential to Achieve Goals: Good  OT Frequency: Min 2X/week   Barriers to D/C:            Co-evaluation              AM-PAC OT "6 Clicks" Daily Activity     Outcome Measure Help from another person eating meals?: None Help from another person taking care of personal grooming?: A Little Help from another person toileting, which includes using toliet, bedpan, or urinal?: A Little Help from another person bathing (including washing, rinsing, drying)?: A Little Help from another person to put on and taking off regular upper body clothing?: None Help from another person to put on and taking off regular lower body clothing?: A Little 6 Click Score: 20   End of Session Nurse Communication: Mobility status  Activity Tolerance: Patient tolerated treatment well Patient left: in chair;with call bell/phone within reach  OT Visit Diagnosis: Other (comment) (decreased activity tolerance )                Time: 3976-7341 OT Time Calculation (min): 31 min Charges:  OT General Charges $OT Visit: 1 Visit OT Evaluation $OT Eval Moderate Complexity: 1 Mod OT Treatments $Self Care/Home Management : 8-22 mins  Joel West, OT Acute Rehabilitation Services Pager [phone] Office [phone]   Joel West 04/23/2020, 11:35 AM

## 2020-04-23 NOTE — Discharge Summary (Addendum)
[facility] Discharge Summary  Patient name: Joel West Medical record number: [id number] Date of birth: 1970-11-14 Age: 49 y.o. Gender: male Date of Admission: 04/19/2020  Date of Discharge: 04/23/2020 Admitting Physician: Joel Naegeli, MD  Primary Care Provider: Zenia Resides, MD Consultants: None  Indication for Hospitalization:  Acute hypoxic respiratory failure due to Covid pneumonia  Discharge Diagnoses/Problem List:  Acute hypoxic respiratory failure due to Covid pneumonia: resolved AKI: resolved Diabetes: Chronic Obstructive sleep apnea: Chronic, stable Hyperlipidemia  Disposition: Discharge to home  Discharge Condition: Stable  Discharge Exam:  Today's Vitals   04/22/20 2000 04/22/20 2038 04/23/20 0459 04/23/20 1359  BP:  140/87 130/86 135/84  Pulse:  74 67 73  Resp:  19 20 20   Temp:  98.4 F (36.9 C) 98.1 F (36.7 C) 98.6 F (37 C)  TempSrc:  Oral  Oral  SpO2:  91% 94% 91%  Weight:      Height:      PainSc: 0-No pain      Body mass index is 42.03 kg/m.  General: Awake, alert, oriented Cardiovascular: Regular rate and rhythm, S1 and S2 present, no murmurs auscultated Respiratory: Lung fields clear to auscultation bilaterally Extremities: No bilateral lower extremity edema, palpable pedal and pretibial pulses bilaterally Neuro: Cranial nerves II through X grossly intact, able to move all extremities spontaneously   Brief Hospital Course:  Acute hypoxic respiratory failure due to Covid pneumonia: resolved Mr. Joel West presented to the ED with fever, cough, malaise, myalgia, diarrhea x1 week.  Covid test positive.  Creatinine 1.27, calcium 8.6, AST 103, ALT 56, total protein 8.2, platelets 107.  C-reactive protein 19.0. Patient s/p second Covid vaccination 8/15. Symptom onset 4 days after second vaccine dose so technically not yet fully vaccinated. Patient received monoclonal antibody infusion in ED; ED provider intended to  discharge home but patient developed worsening tachypnea and hypoxia, requiring 2 L O2.  Patient remained hemodynamically stable.  Patient admitted to family medicine service 8/27 and started on Decadron and remdesivir.  By day 2 of admission (8/28) patient no longer required oxygen at rest.  Patient desatted to 88-90% on room air on 8/29 without increased work of breathing.  On 8/30, day of discharge, patient SpO2 91-94% on room air.  AST and ALT down trended throughout admission, were 53 and 64 respectively on day of discharge.  C-reactive protein down trended to 8.0.  AKI: resolved Patient baseline creatinine around 1.  Cr found to be 1.27 day of admission, likely due to decreased appetite, decreased p.o. intake, increased diarrhea.  Resolved with IV fluids.  Cr 1.02 day of discharge.  Diabetes: chronic A1c 5.8 in February 2021, found to be 6.5 during admission.  Glucose 144 on admission.  CMP glucose ranged 165-185 throughout admission, likely steroids and illness contributing.  Recommend outpatient follow-up.  Obstructive sleep apnea: chronic, stable Patient diagnosed with severe obstructive sleep apnea in 2014, states he wears CPAP at night.  Refused CPAP during hospitalization 8/28-29.  Recommend outpatient follow-up.    Issues for Follow Up:  1. COVID pneumonia - follow-up on SPO2 on room air, work of breathing, vital signs. 2. Diabetes - A1c 6.5% on 8/29.  Started Metformin upon discharge, follow-up with adherence and side effects, fingerstick blood glucose. 3. Hyperlipidemia - continue on rosuvastatin 20 mg and aspirin 81 mg.  ASCVD risk 5.9%. 4. OSA - follow-up on CPAP use, declined CPAP 8/28-29 while hospitalized.   Significant Procedures: None  Significant Labs and Imaging:  Recent Labs  Lab 04/21/20 0142 04/22/20 0256 04/23/20 0331  WBC 7.0 7.2 8.7  HGB 13.9 13.6 13.9  HCT 42.3 42.4 42.2  PLT 102* 135* 167   Recent Labs  Lab 04/19/20 2133 04/19/20 2133 04/21/20 0142  04/21/20 0142 04/22/20 0256 04/23/20 0331  NA 138  --  138  --  140 139  K 3.6   < > 3.5   < > 3.6 3.5  CL 102  --  103  --  104 104  CO2 23  --  25  --  25 24  GLUCOSE 144*  --  165*  --  185* 184*  BUN 10  --  9  --  15 16  CREATININE 1.27*  --  1.04  --  1.03 1.02  CALCIUM 8.6*  --  8.3*  --  8.6* 8.7*  ALKPHOS 60  --  55  --  48 52  AST 103*  --  106*  --  88* 53*  ALT 56*  --  64*  --  72* 64*  ALBUMIN 3.9  --  3.2*  --  3.1* 3.1*   < > = values in this interval not displayed.   Portable chest 1 View  Result Date: 04/20/2020 CLINICAL DATA:  Shortness of breath, fever. EXAM: PORTABLE CHEST 1 VIEW COMPARISON:  None. FINDINGS: The heart size and mediastinal contours are within normal limits. No pneumothorax pleural effusion is noted. Mild bibasilar opacities are noted concerning for subsegmental atelectasis or infiltrates. The visualized skeletal structures are unremarkable. IMPRESSION: Mild bibasilar subsegmental atelectasis or infiltrates are noted. Electronically Signed   By: Joel West M.D.   On: 04/20/2020 12:46     Results/Tests Pending at Time of Discharge: None  Discharge Medications:  Allergies as of 04/23/2020   No Known Allergies     Medication List    STOP taking these medications   ibuprofen 200 MG tablet Commonly known as: ADVIL     TAKE these medications   acetaminophen 325 MG tablet Commonly known as: TYLENOL Take 2 tablets (650 mg total) by mouth every 6 (six) hours as needed for mild pain or headache (fever >/= 101).   aspirin EC 81 MG tablet Take 1 tablet (81 mg total) by mouth daily.   rosuvastatin 20 MG tablet Commonly known as: CRESTOR Take 20 mg by mouth daily.       Discharge Instructions: Please refer to Patient Instructions section of EMR for full details.  Patient was counseled important signs and symptoms that should prompt return to medical care, changes in medications, dietary instructions, activity restrictions, and follow up  appointments.   Follow-Up Appointments: Appointment with Dr. Andria [facility] at [facility] on 05/17/2020.  Joel Essex, MD 04/23/2020, 3:14 PM PGY-1, [place]

## 2020-04-23 NOTE — Progress Notes (Signed)
Chaplain spoke with nurse about not being able to complete Advanced Directive paperwork due to the transfer of paperwork and not being able to have notary and witnesses come on the floor due to Joel West.  Chaplain can provide education over the phone but will not be able to process paperwork.  Joel West would need to obtain a new AD packet when he leaves the unit.   Chaplain will follow-up if needed.

## 2020-04-23 NOTE — Progress Notes (Signed)
SATURATION QUALIFICATIONS: (This note is used to comply with regulatory documentation for home oxygen)  Patient Saturations on Room Air at Rest = 100%  Patient Saturations on Room Air while Ambulating = 88-96% (unable to obtain good pleth throughout entirety of ambulation - measured via O2 probe on earlobe)  Patient Saturations on N/A Liters of oxygen while Ambulating = N/A  Please briefly explain why patient needs home oxygen: N/A   Full OT progress note to follow.  Lou Cal, OT Acute Rehabilitation Services Pager [phone] Office [phone]

## 2020-04-23 NOTE — TOC Initial Note (Signed)
Transition of Care [facility]) - Initial/Assessment Note    Patient Details  Name: Joel West MRN: [id number] Date of Birth: 02/13/1971  [facility]) CM/SW Contact:    Verdell Carmine, RN Phone Number: 04/23/2020, 8:48 AM  Clinical Narrative:                 Admitted 2 days ago with COVID now off oxygen sats on room air when ambulating 88 to 90 will retest oxygen on ambulation today, may discharge today, no HH needs identified.   Expected Discharge Plan: Home/Self Care Barriers to Discharge: No Barriers Identified   Patient Goals and CMS Choice        Expected Discharge Plan and Services Expected Discharge Plan: Home/Self Care                                              Prior Living Arrangements/Services     Patient language and need for interpreter reviewed:: Yes        Need for Family Participation in Patient Care: Yes (Comment) Care giver support system in place?: Yes (comment)      Activities of Daily Living Home Assistive Devices/Equipment: None ADL Screening (condition at time of admission) Patient's cognitive ability adequate to safely complete daily activities?: Yes Is the patient deaf or have difficulty hearing?: No Does the patient have difficulty seeing, even when wearing glasses/contacts?: No Does the patient have difficulty concentrating, remembering, or making decisions?: No Patient able to express need for assistance with ADLs?: Yes Does the patient have difficulty dressing or bathing?: No Independently performs ADLs?: Yes (appropriate for developmental age) Does the patient have difficulty walking or climbing stairs?: No Weakness of Legs: Both Weakness of Arms/Hands: None  Permission Sought/Granted                  Emotional Assessment       Orientation: : Oriented to Self, Oriented to Place, Oriented to  Time, Oriented to Situation Alcohol / Substance Use: Not Applicable Psych Involvement: No  (comment)  Admission diagnosis:  Shortness of breath [R06.02] Acute respiratory failure with hypoxia ([place]) [J96.01] Pneumonia due to COVID-19 virus [U07.1, J12.82] Patient Active Problem List   Diagnosis Date Noted  . Acute respiratory failure with hypoxia ([place])   . Shortness of breath   . Pneumonia due to COVID-19 virus 04/20/2020  . AKI (acute kidney injury) ([place])   . Folliculitis 78/93/8101  . Pain of toe of right foot 02/16/2020  . Hx of adenomatous colonic polyps 10/14/2019  . Onychomycosis 09/26/2019  . Prostate cancer screening 09/26/2019  . Prediabetes 09/26/2019  . Encounter for health maintenance examination 12/24/2016  . Osteoarthritis, multiple sites 12/24/2016  . Lumbar back pain with radiculopathy affecting left lower extremity 02/16/2015  . Obstructive sleep apnea 10/22/2012  . HYPERCHOLESTEROLEMIA 10/22/2006  . Morbid obesity ([place]) 10/22/2006   PCP:  Zenia Resides, MD Pharmacy:   [facility] ([place]), [place] - [address] [place] ([place]) [place] [address] Phone: [phone] Fax: [phone]     Social Determinants of Health (SDOH) Interventions    Readmission Risk Interventions No flowsheet data found.

## 2020-04-23 NOTE — TOC Transition Note (Addendum)
Transition of Care [facility]) - CM/SW Discharge Note   Patient Details  Name: Joel West MRN: [id number] Date of Birth: 04/06/1971  Transition of Care [facility]) CM/SW Contact:  Verdell Carmine, RN Phone Number: 04/23/2020, 10:27 AM   Clinical Narrative:    Patient will not need oxygen going home,Qualifications revealed that room air saturations stayed above 88% on ambulation. is not in need of DME or home health at this time.    Final next level of care: Home/Self Care Barriers to Discharge: No Barriers Identified   Patient Goals and CMS Choice        Discharge Placement                       Discharge Plan and Services                                     Social Determinants of Health (SDOH) Interventions     Readmission Risk Interventions No flowsheet data found.

## 2020-04-23 NOTE — Discharge Instructions (Signed)
Dear Joel West,   Thank you so much for allowing [place] to be part of your care! You were admitted to [facility] for COVID-19 Pneumonia. While here you were treated with oxygen, remdesivir, and decadron. We are happy to see that you were able to wean from supplemental oxygen. Because your infection was severe enough for you to be hospitalized, you will need to quarantine for a total of 21 days which should end on 9/10.   Please call the [facility] if you begin to have worsening symptoms between now and the end of your period of isolation. [phone]  POST-HOSPITAL & CARE INSTRUCTIONS 1. We have prescribed Tylenol for you to take at home if you have fevers/pain.  2. We do not recommend taking NSAIDs (ibuprofen, advil, motrin) while recovering from COVID-19.  3. Please let PCP/Specialists know of any changes that were made.  4. Please see medications section of this packet for any medication changes.   DOCTOR'S APPOINTMENT & FOLLOW UP CARE INSTRUCTIONS  Future Appointments  Date Time Provider [facility] [place]  05/17/2020 11:10 AM Hensel, Jamal Collin, MD [facility] [place]    RETURN PRECAUTIONS: If you develop fever above 100.4 degrees, have trouble breathing, or become significantly ill, please seek care either by going to the emergency room, calling the [facility] family medicine clinic or our after-hours phone line. [phone].   Take care and be well!  [facility]  [place], [place] [address] [phone]

## 2020-05-17 ENCOUNTER — Ambulatory Visit: Payer: Federal, State, Local not specified - PPO | Admitting: Family Medicine

## 2020-05-21 DIAGNOSIS — G4733 Obstructive sleep apnea (adult) (pediatric): Secondary | ICD-10-CM | POA: Diagnosis not present

## 2020-05-24 ENCOUNTER — Other Ambulatory Visit: Payer: Self-pay

## 2020-05-24 ENCOUNTER — Ambulatory Visit: Payer: Federal, State, Local not specified - PPO | Admitting: Family Medicine

## 2020-05-28 ENCOUNTER — Encounter: Payer: Self-pay | Admitting: Family Medicine

## 2020-05-28 ENCOUNTER — Ambulatory Visit (INDEPENDENT_AMBULATORY_CARE_PROVIDER_SITE_OTHER): Payer: Federal, State, Local not specified - PPO | Admitting: Family Medicine

## 2020-05-28 ENCOUNTER — Other Ambulatory Visit: Payer: Self-pay

## 2020-05-28 DIAGNOSIS — M5416 Radiculopathy, lumbar region: Secondary | ICD-10-CM

## 2020-05-28 DIAGNOSIS — R7303 Prediabetes: Secondary | ICD-10-CM | POA: Diagnosis not present

## 2020-05-28 DIAGNOSIS — U071 COVID-19: Secondary | ICD-10-CM | POA: Diagnosis not present

## 2020-05-28 DIAGNOSIS — J1282 Pneumonia due to coronavirus disease 2019: Secondary | ICD-10-CM

## 2020-05-28 NOTE — Patient Instructions (Addendum)
Please bring in a copy of your COVID vaccination card so that we can update our records. I will be happy to fill out your FMLA paperwork.  Please do your part and then drop it off for me to complete. You are officially diabetic.  You can change that to go back to prediabetes if you work on diet, exercise and weight loss.  Type 2 diabetes.  I will give you 3 months to get your sugar under better control before I label you as diabetic.   See me in three months.  I will check again about the diabetes

## 2020-05-28 NOTE — Assessment & Plan Note (Signed)
Focus on diet and exercise.  At age 49, weight related problems are beginning to catch up with him.

## 2020-05-28 NOTE — Progress Notes (Signed)
    SUBJECTIVE:   CHIEF COMPLAINT / HPI:   Multiple issues: 1. FU hospitalization for COVID pneumonia.  Patient has fully recovered.  No longer has dyspnea.  He is also fully vaccinated 2. Needs flu shot.  Refuses.  Unfortunately, became symptomatic with COVID shortly after receiving second covid vaccine.  He worries this flu shot will make him sick.  He did consent to a tetanus booster. 3. On the cusp of Type 2 DM.  A1C in hospital is 6.5 up from 5.8 less than one year ago.  Risk factors are family history and morbid obesity. 4. Needs paperwork renewed for his FMLA due to chronic back pain.  Chronic problem with intermitant flairs.  Not bilateral.  No bowel or bladder sx.     OBJECTIVE:   BP 128/64   Pulse 63   Wt (!) 313 lb 9.6 oz (142.2 kg)   SpO2 100%   BMI 43.74 kg/m   Wt noted Lungs clear Cardiac RRR without m or g Low back bilateral low lumbar paraspinous tendersness  ASSESSMENT/PLAN:   Pneumonia due to COVID-19 virus Resolved: no further FU needed  Lumbar back pain with radiculopathy affecting left lower extremity Chronic and stable.  I will complete FMLA paperwork once he prints and brings in with his section completed  Morbid obesity ([place]) Focus on diet and exercise.  At age 49, weight related problems are beginning to catch up with him.    Prediabetes Currently on the cusp of the Dx of DM.  3 months FU with interim focus on diet and exercise.       Zenia Resides, MD [place]

## 2020-05-28 NOTE — Assessment & Plan Note (Signed)
Resolved: no further FU needed

## 2020-05-28 NOTE — Assessment & Plan Note (Signed)
Currently on the cusp of the Dx of DM.  3 months FU with interim focus on diet and exercise.

## 2020-05-28 NOTE — Assessment & Plan Note (Signed)
Chronic and stable.  I will complete FMLA paperwork once he prints and brings in with his section completed

## 2020-06-21 ENCOUNTER — Telehealth: Payer: Self-pay | Admitting: Family Medicine

## 2020-06-21 NOTE — Telephone Encounter (Signed)
COVID Vaccination's have been documented. Will put copy of card in "to be scanned". Ottis Stain, CMA

## 2020-06-21 NOTE — Telephone Encounter (Signed)
Pt walked in and provided Covid Vaccination Record Card; Copy placed in white team folder

## 2020-08-21 DIAGNOSIS — G4733 Obstructive sleep apnea (adult) (pediatric): Secondary | ICD-10-CM | POA: Diagnosis not present

## 2020-09-08 ENCOUNTER — Other Ambulatory Visit: Payer: Self-pay | Admitting: Family Medicine

## 2020-09-08 DIAGNOSIS — M8949 Other hypertrophic osteoarthropathy, multiple sites: Secondary | ICD-10-CM

## 2020-09-08 DIAGNOSIS — M159 Polyosteoarthritis, unspecified: Secondary | ICD-10-CM

## 2020-09-18 ENCOUNTER — Telehealth: Payer: Self-pay | Admitting: Family Medicine

## 2020-09-18 NOTE — Telephone Encounter (Signed)
Patient dropped off forms to be completed by Dr. Andria Frames and Mailed off. Last DOS: 05/28/2020.Call back number is [phone]. Placing forms in the white team folder. Thanks

## 2020-09-24 NOTE — Telephone Encounter (Signed)
I will leave at front desk for him to pick up.

## 2020-09-24 NOTE — Telephone Encounter (Signed)
Clinical info completed on FMLA form.  Place form in Dr. Lowella Bandy box for completion.  Joel West, CMA

## 2020-09-24 NOTE — Telephone Encounter (Signed)
Called patient and explained that the paperwork he gave me was not for me to fill out.  It was notifying him that his FMLA had been approved.

## 2020-11-19 DIAGNOSIS — G4733 Obstructive sleep apnea (adult) (pediatric): Secondary | ICD-10-CM | POA: Diagnosis not present

## 2020-12-11 DIAGNOSIS — G4733 Obstructive sleep apnea (adult) (pediatric): Secondary | ICD-10-CM | POA: Diagnosis not present

## 2021-02-14 DIAGNOSIS — G4733 Obstructive sleep apnea (adult) (pediatric): Secondary | ICD-10-CM | POA: Diagnosis not present

## 2021-03-20 DIAGNOSIS — G4733 Obstructive sleep apnea (adult) (pediatric): Secondary | ICD-10-CM | POA: Diagnosis not present

## 2021-06-24 DIAGNOSIS — G4733 Obstructive sleep apnea (adult) (pediatric): Secondary | ICD-10-CM | POA: Diagnosis not present

## 2021-07-24 DIAGNOSIS — G4733 Obstructive sleep apnea (adult) (pediatric): Secondary | ICD-10-CM | POA: Diagnosis not present

## 2021-08-24 DIAGNOSIS — G4733 Obstructive sleep apnea (adult) (pediatric): Secondary | ICD-10-CM | POA: Diagnosis not present

## 2021-10-29 ENCOUNTER — Other Ambulatory Visit: Payer: Self-pay | Admitting: Family Medicine

## 2021-10-29 DIAGNOSIS — M159 Polyosteoarthritis, unspecified: Secondary | ICD-10-CM

## 2021-11-11 DIAGNOSIS — K080 Exfoliation of teeth due to systemic causes: Secondary | ICD-10-CM | POA: Diagnosis not present

## 2021-12-26 ENCOUNTER — Telehealth: Payer: Self-pay | Admitting: Family Medicine

## 2021-12-26 ENCOUNTER — Encounter: Payer: Self-pay | Admitting: Family Medicine

## 2021-12-26 ENCOUNTER — Ambulatory Visit (INDEPENDENT_AMBULATORY_CARE_PROVIDER_SITE_OTHER): Payer: Federal, State, Local not specified - PPO | Admitting: Family Medicine

## 2021-12-26 DIAGNOSIS — G4733 Obstructive sleep apnea (adult) (pediatric): Secondary | ICD-10-CM | POA: Diagnosis not present

## 2021-12-26 DIAGNOSIS — R7401 Elevation of levels of liver transaminase levels: Secondary | ICD-10-CM

## 2021-12-26 DIAGNOSIS — R7303 Prediabetes: Secondary | ICD-10-CM | POA: Diagnosis not present

## 2021-12-26 DIAGNOSIS — E7800 Pure hypercholesterolemia, unspecified: Secondary | ICD-10-CM

## 2021-12-26 DIAGNOSIS — Z0000 Encounter for general adult medical examination without abnormal findings: Secondary | ICD-10-CM

## 2021-12-26 DIAGNOSIS — Z23 Encounter for immunization: Secondary | ICD-10-CM

## 2021-12-26 DIAGNOSIS — Z125 Encounter for screening for malignant neoplasm of prostate: Secondary | ICD-10-CM | POA: Diagnosis not present

## 2021-12-26 HISTORY — DX: Elevation of levels of liver transaminase levels: R74.01

## 2021-12-26 MED ORDER — MOUNJARO 2.5 MG/0.5ML ~~LOC~~ SOAJ: 2.5000 mg | SUBCUTANEOUS | 0 refills | Status: DC

## 2021-12-26 MED ORDER — ZOSTER VAC RECOMB ADJUVANTED 50 MCG/0.5ML IM SUSR: 0.5000 mL | Freq: Once | INTRAMUSCULAR | 1 refills | Status: AC

## 2021-12-26 NOTE — Assessment & Plan Note (Addendum)
Due for lipid panel check ?Called with lab, sky high LDL.  He has not been taking his crestor.  Emphasized how important this is .  Sent in new Rx for Crestor 40.  Recheck LDL in 3 months.   ?

## 2021-12-26 NOTE — Patient Instructions (Addendum)
Please find out what shots you got at employee health and bring [place] a copy.  We need to update our records.   ?I sent in a prescription to [facility] for a shingles vaccine.  Remember to take it when it would be OK if you got sick. ?I sent in the weight loss and diabetes medicine to [facility].  Once a week shot.  After four weeks, I will increase the dose. ?I will call with blood work results ?Someone should call to set up the new sleep study. ?When I call with the blood test results, we can talk about when I will see you in follow up. ?

## 2021-12-26 NOTE — Assessment & Plan Note (Signed)
Begin mounjaro. ?

## 2021-12-26 NOTE — Assessment & Plan Note (Signed)
Reordered sleep study.  Given previous severe sleep apnea, important to have treatment. ?

## 2021-12-26 NOTE — Telephone Encounter (Signed)
Pt has brought in an Employee Medical Activity History placed in the green team folder.  Pt request a call from the doctor to discuss report.  Ph # [phone]  ?

## 2021-12-26 NOTE — Assessment & Plan Note (Signed)
Check A1C.  Regardless, starting on Mounjaro ?

## 2021-12-26 NOTE — Assessment & Plan Note (Signed)
51 yo male with healthy diet and exercise.  Obesity and related disorders are his main problem ?

## 2021-12-26 NOTE — Assessment & Plan Note (Signed)
Screening as desired. ?

## 2021-12-26 NOTE — Progress Notes (Signed)
? ? ?  SUBJECTIVE:  ? ?CHIEF COMPLAINT / HPI:  ? ?Annual physical:  Last office visit was 18 months ago.  Has good insurance.  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 ?Issues/concerns/complaints. ?1 Sleep apnea.  Confirmed by sleep study 2014.  Severe sleep apnea.  Wt has gone up since then. Thinks either his mask or his setting not right.  Maybe has daytime sleepiness but with him working 80 hour per week it is difficult to tell. ?2. Morbid obesity.  States he is eating healthy and getting his steps in.  Weight fluctuates up and down with a long term trend of weight gain. ?3. Prediabetes.  Due for A1C check. ?4. Chronic low back pain, stable.   ?5. Hypercholesterolemia on statin.  Due for check  Primary prevention ?6. Hx of mild transaminitis.  Weight puts him at risk for fatty liver.   ? ?HPDP up to date and  ?Desires PSA screening ?May have had COVID and tetanus booster at Employee health at [facility]. ?Knows he has not had shingrix.   ? ?PERTINENT  PMH / PSH: Denies CP, DOE, bleeding, weakness or numbness.  No change in bowel, bladder or appetite ? ? ?OBJECTIVE:  ? ?BP 128/80   Pulse (!) 56   Ht '5\' 10"'$  (1.778 m)   Wt (!) 333 lb 9.6 oz (151.3 kg)   SpO2 99%   BMI 47.87 kg/m?   ?HEENT WNL ?Neck supple ?Lungs clear ?Cardiac rRR without m or g ?Abd benign ?Ext no edema. ?Neuro Motor, sensory, gait, cognition and affect grossly normal.   ? ?ASSESSMENT/PLAN:  ? ?Encounter for preventive care ?51 yo male with healthy diet and exercise.  Obesity and related disorders are his main problem ? ?Transaminitis ?Start by rechecking labs. ? ?Need for shingles vaccine ?Wants vaccine, Educated about potential side effects. ? ?Prediabetes ?Check A1C.  Regardless, starting on Mounjaro ? ?Morbid obesity ([place]) ?Begin mounjaro. ? ?Prostate cancer screening ?Screening as desired. ? ?HYPERCHOLESTEROLEMIA ?Due for lipid panel check ? ?Obstructive sleep apnea ?Reordered sleep study.  Given previous severe sleep  apnea, important to have treatment.  ? ? ?Zenia Resides, MD ?[place]  ?

## 2021-12-26 NOTE — Assessment & Plan Note (Signed)
Start by rechecking labs. ?

## 2021-12-26 NOTE — Assessment & Plan Note (Signed)
Wants vaccine, Educated about potential side effects. ?

## 2021-12-27 ENCOUNTER — Telehealth: Payer: Self-pay

## 2021-12-27 LAB — CMP14+EGFR
ALT: 23 IU/L (ref 0–44)
AST: 25 IU/L (ref 0–40)
Albumin/Globulin Ratio: 1.5 (ref 1.2–2.2)
Albumin: 4.3 g/dL (ref 3.8–4.9)
Alkaline Phosphatase: 97 IU/L (ref 44–121)
BUN/Creatinine Ratio: 16 (ref 9–20)
BUN: 16 mg/dL (ref 6–24)
Bilirubin Total: 0.6 mg/dL (ref 0.0–1.2)
CO2: 24 mmol/L (ref 20–29)
Calcium: 8.7 mg/dL (ref 8.7–10.2)
Chloride: 105 mmol/L (ref 96–106)
Creatinine, Ser: 1.01 mg/dL (ref 0.76–1.27)
Globulin, Total: 2.8 g/dL (ref 1.5–4.5)
Glucose: 136 mg/dL — ABNORMAL HIGH (ref 70–99)
Potassium: 3.9 mmol/L (ref 3.5–5.2)
Sodium: 141 mmol/L (ref 134–144)
Total Protein: 7.1 g/dL (ref 6.0–8.5)
eGFR: 89 mL/min/{1.73_m2} (ref >59)

## 2021-12-27 LAB — LIPID PANEL
Chol/HDL Ratio: 6.6 ratio — ABNORMAL HIGH (ref 0.0–5.0)
Cholesterol, Total: 251 mg/dL — ABNORMAL HIGH (ref 100–199)
HDL: 38 mg/dL — ABNORMAL LOW (ref >39)
LDL Chol Calc (NIH): 194 mg/dL — ABNORMAL HIGH (ref 0–99)
Triglycerides: 107 mg/dL (ref 0–149)
VLDL Cholesterol Cal: 19 mg/dL (ref 5–40)

## 2021-12-27 LAB — TSH: TSH: 1 u[IU]/mL (ref 0.450–4.500)

## 2021-12-27 LAB — PSA: Prostate Specific Ag, Serum: 0.4 ng/mL (ref 0.0–4.0)

## 2021-12-27 NOTE — Telephone Encounter (Signed)
A Prior Authorization was initiated for this patients MOUNJARO through CoverMyMeds.  ? ?Key:  [id number] ? ?

## 2021-12-30 ENCOUNTER — Other Ambulatory Visit (HOSPITAL_COMMUNITY): Payer: Self-pay

## 2021-12-30 ENCOUNTER — Telehealth: Payer: Self-pay

## 2021-12-30 ENCOUNTER — Other Ambulatory Visit (INDEPENDENT_AMBULATORY_CARE_PROVIDER_SITE_OTHER): Payer: Federal, State, Local not specified - PPO

## 2021-12-30 DIAGNOSIS — R7303 Prediabetes: Secondary | ICD-10-CM

## 2021-12-30 LAB — POCT GLYCOSYLATED HEMOGLOBIN (HGB A1C): HbA1c, POC (prediabetic range): 5.8 % (ref 5.7–6.4)

## 2021-12-30 MED ORDER — OZEMPIC (0.25 OR 0.5 MG/DOSE) 2 MG/1.5ML ~~LOC~~ SOPN: 0.2500 mg | PEN_INJECTOR | SUBCUTANEOUS | 0 refills | Status: DC

## 2021-12-30 MED ORDER — ROSUVASTATIN CALCIUM 40 MG PO TABS: 40.0000 mg | ORAL_TABLET | Freq: Every day | ORAL | 3 refills | Status: DC

## 2021-12-30 MED ORDER — OZEMPIC (0.25 OR 0.5 MG/DOSE) 2 MG/1.5ML ~~LOC~~ SOPN: 0.5000 mg | PEN_INJECTOR | SUBCUTANEOUS | 0 refills | Status: DC

## 2021-12-30 NOTE — Telephone Encounter (Signed)
I notified patient.  We may represcribe if he tests positive for diabetes. ?

## 2021-12-30 NOTE — Telephone Encounter (Signed)
Prior Auth for patients medication MOUNJARO denied by [place] via CoverMyMeds.  ? ?Reason: the use of this medication for the prevention of diabetes AND for weightloss are considered an experimental or investigational indication for this drug ? ?CoverMyMeds Key: [id number] ? ?Attempted to call in a Mounjaro copay to patients pharmacy (since patients insurance doesn't provide coverage). Copay card gave $500 copay. ? ?Denial letter scanned to chart. ? ?

## 2021-12-30 NOTE — Telephone Encounter (Signed)
[facility] LVM on nurse line requesting clarification on Ozempic directions.  ? ?Pharmacy reports they believe PCP meant start at 0.'25mg'$  and increase to 0.'5mg'$  4 weeks later.  ? ?Please resend if this was intended.  ?

## 2021-12-30 NOTE — Telephone Encounter (Signed)
Yes, good catch by pharmacy.  Rx resent corrected. ?

## 2021-12-30 NOTE — Addendum Note (Signed)
Addended by: Zenia Resides on: 12/30/2021 03:15 PM ? ? Modules accepted: Orders ? ?

## 2021-12-30 NOTE — Addendum Note (Signed)
Addended by: Zenia Resides on: 12/30/2021 11:29 AM ? ? Modules accepted: Orders ? ?

## 2021-12-30 NOTE — Assessment & Plan Note (Signed)
Not diabetic.  Insurance denied mounjaro for weight loss.  Will try semaglutide. ?

## 2021-12-31 NOTE — Telephone Encounter (Signed)
Reviewed and placed in to be scanned. ?

## 2021-12-31 NOTE — Telephone Encounter (Signed)
Forms placed in PCP box for review. Once reviewed please initial and date each sheet and place in order level scanning. Joel West, CMA ? ?

## 2022-01-28 ENCOUNTER — Encounter: Payer: Self-pay | Admitting: *Deleted

## 2022-02-03 ENCOUNTER — Other Ambulatory Visit: Payer: Self-pay | Admitting: Family Medicine

## 2022-02-06 ENCOUNTER — Other Ambulatory Visit (HOSPITAL_COMMUNITY): Payer: Self-pay

## 2022-02-06 MED ORDER — ZOSTER VAC RECOMB ADJUVANTED 50 MCG/0.5ML IM SUSR
0.5000 mL | Freq: Every day | INTRAMUSCULAR | 1 refills | Status: DC
  Filled 2022-02-06: qty 0.5, 1d supply, fill #0
  Filled 2022-04-14: qty 0.5, 1d supply, fill #1

## 2022-02-10 ENCOUNTER — Telehealth: Payer: Self-pay

## 2022-02-10 ENCOUNTER — Other Ambulatory Visit (HOSPITAL_COMMUNITY): Payer: Self-pay

## 2022-02-10 NOTE — Telephone Encounter (Signed)
Patient LVM on nurse line regarding issues with picking up Ozempic. Called pharmacy. Medication requires PA. Will forward to [place] for completion.   Talbot Grumbling, RN

## 2022-02-10 NOTE — Telephone Encounter (Signed)
Correction, patient has 2 insurances. Primary insurance looks to be a federal plan and cannot be used with that specific copay card. Secondary insurance ([employer]) requires step therapy. I have not rec'd a PA request from pharmacy but will initiate one. Looks like he used metformin in the past.

## 2022-02-10 NOTE — Telephone Encounter (Signed)
A Prior Authorization was initiated for this patients OZEMPIC through CoverMyMeds.   Submitted for secondary insurance MED[employer]  Key: [facility]

## 2022-02-11 NOTE — Telephone Encounter (Signed)
Patient calls nurse line multiple times regarding ozempic. Provided patient with update that PA is pending and that we will contact him once we receive determination from insurance company.   Talbot Grumbling, RN

## 2022-02-12 DIAGNOSIS — E119 Type 2 diabetes mellitus without complications: Secondary | ICD-10-CM | POA: Diagnosis not present

## 2022-02-12 DIAGNOSIS — H5202 Hypermetropia, left eye: Secondary | ICD-10-CM | POA: Diagnosis not present

## 2022-03-31 ENCOUNTER — Telehealth: Payer: Self-pay

## 2022-03-31 MED ORDER — SEMAGLUTIDE (1 MG/DOSE) 4 MG/3ML ~~LOC~~ SOPN: 1.0000 mg | PEN_INJECTOR | SUBCUTANEOUS | 6 refills | Status: DC

## 2022-03-31 NOTE — Telephone Encounter (Signed)
Patient calls nurse line regarding Ozempic.   Patient reports he has been injecting .'5mg'$  for one month now. Patient denies any "noticeable" side effects.   Patient reports he believes he is ready to move up to next dose.   Will forward to PCP for advisement.

## 2022-03-31 NOTE — Telephone Encounter (Signed)
Sent in higher dose and informed patient.

## 2022-04-10 DIAGNOSIS — G4733 Obstructive sleep apnea (adult) (pediatric): Secondary | ICD-10-CM | POA: Diagnosis not present

## 2022-04-14 ENCOUNTER — Other Ambulatory Visit (HOSPITAL_COMMUNITY): Payer: Self-pay

## 2022-04-15 ENCOUNTER — Other Ambulatory Visit (HOSPITAL_COMMUNITY): Payer: Self-pay

## 2022-04-16 ENCOUNTER — Other Ambulatory Visit (HOSPITAL_COMMUNITY): Payer: Self-pay

## 2022-04-20 ENCOUNTER — Ambulatory Visit (HOSPITAL_BASED_OUTPATIENT_CLINIC_OR_DEPARTMENT_OTHER): Payer: Federal, State, Local not specified - PPO | Attending: Family Medicine | Admitting: Internal Medicine

## 2022-04-20 VITALS — Ht 70.0 in | Wt 312.0 lb

## 2022-04-20 DIAGNOSIS — G4733 Obstructive sleep apnea (adult) (pediatric): Secondary | ICD-10-CM | POA: Insufficient documentation

## 2022-04-27 DIAGNOSIS — G4733 Obstructive sleep apnea (adult) (pediatric): Secondary | ICD-10-CM | POA: Diagnosis not present

## 2022-04-27 NOTE — Procedures (Signed)
     Patient Name: Joel West, Joel West Date: 04/20/2022 Gender: Male D.O.B: 02/02/1971 Age (years): 51 Referring Provider: Madison Hickman Height (inches): 67 Interpreting Physician: Baird Lyons MD, ABSM Weight (lbs): 312 RPSGT: [place], [place] BMI: 45 MRN: [id number] Neck Size: 19.00  CLINICAL INFORMATION The patient is referred for a BiPAP titration to treat sleep apnea.  Date of NPSG, Split Night or HST: 11/11/12   NPSG AHI 93.7/ hr, desaturation to 76%, body weight  SLEEP STUDY TECHNIQUE As per the AASM Manual for the Scoring of Sleep and Associated Events v2.3 (April 2016) with a hypopnea requiring 4% desaturations.  The channels recorded and monitored were frontal, central and occipital EEG, electrooculogram (EOG), submentalis EMG (chin), nasal and oral airflow, thoracic and abdominal wall motion, anterior tibialis EMG, snore microphone, electrocardiogram, and pulse oximetry. Bilevel positive airway pressure (BPAP) was initiated at the beginning of the study and titrated to treat sleep-disordered breathing.  MEDICATIONS Medications self-administered by patient taken the night of the study : N/A  RESPIRATORY PARAMETERS Optimal IPAP Pressure (cm): 21 AHI at Optimal Pressure (/hr) 0 Optimal EPAP Pressure (cm): 16   Overall Minimal O2 (%): 85.0 Minimal O2 at Optimal Pressure (%): 94.0 SLEEP ARCHITECTURE Start Time: 10:20:03 PM Stop Time: 4:51:23 AM Total Time (min): 391.3 Total Sleep Time (min): 311.8 Sleep Latency (min): 43.0 Sleep Efficiency (%): 79.7% REM Latency (min): 81.5 WASO (min): 36.5 Stage N1 (%): 7.7% Stage N2 (%): 75.2% Stage N3 (%): 0.0% Stage R (%): 17.1 Supine (%): 100.00 Arousal Index (/hr): 11.7   CARDIAC DATA The 2 lead EKG demonstrated sinus rhythm. The mean heart rate was 60.7 beats per minute. Other EKG findings include: None.  LEG MOVEMENT DATA The total Periodic Limb Movements of Sleep (PLMS) were 0. The PLMS index was 0.0. A PLMS index of <15 is  considered normal in adults.  IMPRESSIONS - CPAP was not tolerrated at effective pressure. Patient feels dependent on BIPAP at home. - An optimal BIPAP pressure was selected for this patient ( 21 /16  cm of water) - Central sleep apnea was not noted during this titration (CAI = 0.8/h). - Moderete oxygen desaturations were observed during this titration (min O2 = 85.0%). Minimum O2 saturation on BIPAP 21/16 was 94%. - The patient snored with soft snoring volume. - No cardiac abnormalities were observed during this study. - Clinically significant periodic limb movements were not noted during this study. Arousals associated with PLMs were rare.  DIAGNOSIS - Obstructive Sleep Apnea (G47.33)  RECOMMENDATIONS - Trial of BiPAP therapy on 21/16 cm H2O or autoBIPAP  Consultation is available if helpful. - Patient used a Small Wide size Resmed Full Face AirFit F30i mask and heated humidification. - Be careful with alcohol, sedatives and other CNS depressants that may worsen sleep apnea and disrupt normal sleep architecture. - Sleep hygiene should be reviewed to assess factors that may improve sleep quality. - Weight management and regular exercise should be initiated or continued.  [Electronically signed] 04/27/2022 11:49 AM  Baird Lyons MD, ABSM Diplomate, American Board of Sleep Medicine NPI: [id number]                        [place] [place] [facility]  ELECTRONICALLY SIGNED ON:  04/27/2022, 11:44 AM [place] PH: [phone]   FX: [phone] [place]

## 2022-04-29 NOTE — Progress Notes (Signed)
Called patient.  He wanted copy of sleep study to take to his DME supplier.  I will be happy to sign DME orders from his supplier.

## 2022-05-11 DIAGNOSIS — G4733 Obstructive sleep apnea (adult) (pediatric): Secondary | ICD-10-CM | POA: Diagnosis not present

## 2022-05-22 ENCOUNTER — Telehealth: Payer: Self-pay

## 2022-05-22 NOTE — Telephone Encounter (Signed)
Patient needs an appointment before I can make this change.

## 2022-05-22 NOTE — Telephone Encounter (Signed)
Patient calls nurse line requesting dosage increase of Ozempic.   Patient reports he is currently taking '1mg'$  weekly. Patient denies any adverse side effects with this dose. Patient requesting '2mg'$ .   Will forward to PCP.

## 2022-05-23 NOTE — Telephone Encounter (Signed)
Contacted patient.   Patient scheduled for 10/23 with PCP.   Patient reports he has one more week of medication left.   Will forward to PCP.

## 2022-05-27 NOTE — Telephone Encounter (Signed)
Called.  Patient has refills.  Asked him to refill current dose until he sees me on 10/23.

## 2022-06-10 DIAGNOSIS — G4733 Obstructive sleep apnea (adult) (pediatric): Secondary | ICD-10-CM | POA: Diagnosis not present

## 2022-06-16 ENCOUNTER — Ambulatory Visit: Payer: Federal, State, Local not specified - PPO | Admitting: Family Medicine

## 2022-08-21 ENCOUNTER — Ambulatory Visit: Payer: Federal, State, Local not specified - PPO | Admitting: Family Medicine

## 2022-08-28 ENCOUNTER — Telehealth: Payer: Self-pay

## 2022-08-28 NOTE — Telephone Encounter (Signed)
A Prior Authorization was initiated for this patients OZEMPIC through CoverMyMeds.   Key: [id number]

## 2022-08-29 NOTE — Telephone Encounter (Signed)
Prior Auth for patients medication OZEMPIC denied by [facility] via CoverMyMeds.   Reason: the use of this medication for weight loss is considered an experimental or investigational indication for this drug.  The Plan may cover FDA approved weight loss agents, including: Lomaira (phentermine), phentermine, benzphetamine, Contrave (naltrexone and bupropion), diethylpropion, phendimetrazine, Qsymia (phentermine and topiramate extended-release), Saxenda (liraglutide), Wegovy(semaglutide), and Xenical (or listat) with prior authorization.  CoverMyMeds Key: [id number]

## 2022-09-01 MED ORDER — WEGOVY 1 MG/0.5ML ~~LOC~~ SOAJ: 1.0000 mg | SUBCUTANEOUS | 3 refills | Status: DC

## 2022-09-01 NOTE — Addendum Note (Signed)
Addended by: Zenia Resides on: 09/01/2022 01:57 PM   Modules accepted: Orders

## 2022-09-01 NOTE — Telephone Encounter (Signed)
Will try Wegovy since it is preferred.  Rx sent.  Patient informed

## 2022-09-03 ENCOUNTER — Telehealth: Payer: Self-pay

## 2022-09-03 NOTE — Telephone Encounter (Signed)
A Prior Authorization was initiated for this patients WEGOVY through CoverMyMeds.   Key: [id number]

## 2022-09-04 NOTE — Telephone Encounter (Addendum)
Prior Auth for patients medication WEGOVY denied by [employer] FEP via CoverMyMeds.   Reason: the use of this medication for treatment of chronic weight management without it being used in combination with lifestyle changes and reduced calorie diet does not establish medical necessity for this drug.  APPEAL AVAILABLE IF PATIENT MEETS CRITERIA AND SUPPORTING DOCUMENTATION AVAILABLE.  CoverMyMeds Key: [id number]

## 2022-09-04 NOTE — Telephone Encounter (Signed)
I called patient.  He has an appointment coming up with me.  We will discuss lifestyle changes and a reduced calorie diet.  After that discussion, will likely try to appeal.

## 2022-09-17 ENCOUNTER — Encounter: Payer: Self-pay | Admitting: Family Medicine

## 2022-09-17 ENCOUNTER — Ambulatory Visit (INDEPENDENT_AMBULATORY_CARE_PROVIDER_SITE_OTHER): Payer: Federal, State, Local not specified - PPO | Admitting: Family Medicine

## 2022-09-17 VITALS — BP 130/62 | HR 69 | Ht 70.0 in | Wt 338.6 lb

## 2022-09-17 DIAGNOSIS — R7303 Prediabetes: Secondary | ICD-10-CM | POA: Diagnosis not present

## 2022-09-17 DIAGNOSIS — E7800 Pure hypercholesterolemia, unspecified: Secondary | ICD-10-CM | POA: Diagnosis not present

## 2022-09-17 LAB — POCT GLYCOSYLATED HEMOGLOBIN (HGB A1C): Hemoglobin A1C: 5.4 % (ref 4.0–5.6)

## 2022-09-17 MED ORDER — WEGOVY 1 MG/0.5ML ~~LOC~~ SOAJ: 1.0000 mg | SUBCUTANEOUS | 3 refills | Status: DC

## 2022-09-17 MED ORDER — ROSUVASTATIN CALCIUM 40 MG PO TABS: 40.0000 mg | ORAL_TABLET | Freq: Every day | ORAL | 3 refills | Status: DC

## 2022-09-17 NOTE — Patient Instructions (Addendum)
Good news: the medicine is working great on the prediabetes. Bad news You need to keep working hard on your weight. You have a very high bad cholesterol.  You need to take your rosuvastatin every day to prevent heart attack and stroke You should also take aspirin 81 mg every day to prevent heart attack and stroke.   I will call with the repeat cholesterol test Thank you.  It has been a pleasure taking care of you.

## 2022-09-17 NOTE — Assessment & Plan Note (Signed)
Recheck d LDL.  Emphasized compliance with statin and aspirin. FU six months.

## 2022-09-17 NOTE — Progress Notes (Signed)
    SUBJECTIVE:   CHIEF COMPLAINT / HPI:   Multiple issues: Prediabetes. Excellent drop in A1C with semaglutide.  No complaints. Morbid obesity; Unfortunately, no weight loss with semaglutide.  States he is eating healthy and active. Hypercholesterolemia.  LDL>190.  States only sporadically compliant with aspirin and rosuvastatin.  No chest pain or SOB.     OBJECTIVE:   BP 130/62   Pulse 69   Ht '5\' 10"'$  (1.778 m)   Wt (!) 338 lb 9.6 oz (153.6 kg)   SpO2 98%   BMI 48.58 kg/m   Lungs clear Cardiac RRR without m or g No peripheral edema  ASSESSMENT/PLAN:   Prediabetes Great control.  Morbid obesity ([place]) He knows he needs to work harder on diet and exercise.  HYPERCHOLESTEROLEMIA Recheck d LDL.  Emphasized compliance with statin and aspirin. FU six months.     Zenia Resides, MD [place]

## 2022-09-17 NOTE — Assessment & Plan Note (Signed)
Great control. 

## 2022-09-17 NOTE — Assessment & Plan Note (Signed)
He knows he needs to work harder on diet and exercise.

## 2022-09-18 LAB — LDL CHOLESTEROL, DIRECT: LDL Direct: 175 mg/dL — ABNORMAL HIGH (ref 0–99)

## 2022-09-30 ENCOUNTER — Encounter: Payer: Self-pay | Admitting: Family Medicine

## 2022-09-30 NOTE — Telephone Encounter (Signed)
Submitted [employer] weight loss medications fax rec'd from company and completed by Dr Andria Frames

## 2022-10-13 NOTE — Telephone Encounter (Signed)
Prior Auth/ appealfor patients medication WEGOVY denied VIA FAX by FEP via CoverMyMeds.   Reason:

## 2022-10-21 ENCOUNTER — Other Ambulatory Visit: Payer: Self-pay | Admitting: Family Medicine

## 2022-10-21 DIAGNOSIS — M159 Polyosteoarthritis, unspecified: Secondary | ICD-10-CM

## 2023-02-17 ENCOUNTER — Other Ambulatory Visit: Payer: Self-pay | Admitting: Family Medicine

## 2023-02-17 DIAGNOSIS — M159 Polyosteoarthritis, unspecified: Secondary | ICD-10-CM

## 2023-03-06 DIAGNOSIS — G4733 Obstructive sleep apnea (adult) (pediatric): Secondary | ICD-10-CM | POA: Diagnosis not present

## 2023-03-10 NOTE — Progress Notes (Unsigned)
   SUBJECTIVE:   CHIEF COMPLAINT / HPI:   Prediabetes, Obesity - Last A1c 5.4 08/2022 - Medications: wegovy - Compliance: hasn't been able to get wegovy due to insurance. Has been off for a few months. - eating salads, not as much carbs. - exercise limited by back pain.  - interested in bariatric surgery  OSA - on CPAP. Tolerating well.   Back pain - low back, cramping with walking. Relieved with ibuprofen.   OBJECTIVE:   BP 134/81   Pulse 65   Ht 5' 10.5" (1.791 m)   Wt (!) 348 lb 3.2 oz (157.9 kg)   SpO2 96%   BMI 49.26 kg/m   Gen: well appearing, in NAD Card: RRR Lungs: CTAB Ext: WWP   ASSESSMENT/PLAN:   Obstructive sleep apnea Tolerant of CPAP, continue  HYPERCHOLESTEROLEMIA Recheck lipid panel  Morbid obesity ([facility]) Unable to lose weight despite diet and exercise efforts. Unfortunately weight gain off of GLP1 agonist. Will refill today but may have issues getting due to insurance coverage. Repeat A1c and labs today. Interested in bariatric surgery, referral placed. F/u 1-2 months.  Prediabetes H/o prediabetes with 1 A1c previously in diabetic range, improved with GLP1 agonist. Will repeat today given off wegovy for few months, may be able to help with insurance coverage. Continue diet and exercise efforts.   Lumbar back pain with radiculopathy affecting left lower extremity Chronic, stable. Weight loss will be of benefit. Refill ibuprofen.     Caro Laroche, DO

## 2023-03-11 ENCOUNTER — Encounter: Payer: Self-pay | Admitting: Family Medicine

## 2023-03-11 ENCOUNTER — Ambulatory Visit (INDEPENDENT_AMBULATORY_CARE_PROVIDER_SITE_OTHER): Payer: Federal, State, Local not specified - PPO | Admitting: Family Medicine

## 2023-03-11 VITALS — BP 134/81 | HR 65 | Ht 70.5 in | Wt 348.2 lb

## 2023-03-11 DIAGNOSIS — Z125 Encounter for screening for malignant neoplasm of prostate: Secondary | ICD-10-CM

## 2023-03-11 DIAGNOSIS — E7800 Pure hypercholesterolemia, unspecified: Secondary | ICD-10-CM | POA: Diagnosis not present

## 2023-03-11 DIAGNOSIS — G4733 Obstructive sleep apnea (adult) (pediatric): Secondary | ICD-10-CM

## 2023-03-11 DIAGNOSIS — M5416 Radiculopathy, lumbar region: Secondary | ICD-10-CM

## 2023-03-11 DIAGNOSIS — R7303 Prediabetes: Secondary | ICD-10-CM

## 2023-03-11 MED ORDER — SEMAGLUTIDE (1 MG/DOSE) 4 MG/3ML ~~LOC~~ SOPN: 1.0000 mg | PEN_INJECTOR | SUBCUTANEOUS | 1 refills | Status: DC

## 2023-03-11 NOTE — Assessment & Plan Note (Signed)
Tolerant of CPAP, continue.

## 2023-03-11 NOTE — Patient Instructions (Addendum)
It was great to see you!  Our plans for today:  - We sent refills to your pharmacy. If you develop side effects with the wegovy, let us know and we can decrease your dose until you are able to tolerate it.  - We are referring you to bariatric surgery. If you don't hear about an appointment in the next few weeks, let us know.  - Come back in 2 months.  We are checking some labs today, we will release these results to your MyChart.  Take care and seek immediate care sooner if you develop any concerns.   Dr. Linwood Dibbles

## 2023-03-11 NOTE — Assessment & Plan Note (Signed)
Unable to lose weight despite diet and exercise efforts. Unfortunately weight gain off of GLP1 agonist. Will refill today but may have issues getting due to insurance coverage. Repeat A1c and labs today. Interested in bariatric surgery, referral placed. F/u 1-2 months.

## 2023-03-11 NOTE — Assessment & Plan Note (Addendum)
Chronic, stable. Weight loss will be of benefit. Refill ibuprofen.

## 2023-03-11 NOTE — Assessment & Plan Note (Signed)
H/o prediabetes with 1 A1c previously in diabetic range, improved with GLP1 agonist. Will repeat today given off wegovy for few months, may be able to help with insurance coverage. Continue diet and exercise efforts.

## 2023-03-11 NOTE — Assessment & Plan Note (Signed)
 Recheck lipid panel 

## 2023-03-12 ENCOUNTER — Encounter: Payer: Self-pay | Admitting: Family Medicine

## 2023-03-12 LAB — COMPREHENSIVE METABOLIC PANEL
ALT: 27 IU/L (ref 0–44)
AST: 25 IU/L (ref 0–40)
Albumin: 4.6 g/dL (ref 3.8–4.9)
Alkaline Phosphatase: 87 IU/L (ref 44–121)
BUN/Creatinine Ratio: 15 (ref 9–20)
BUN: 15 mg/dL (ref 6–24)
Bilirubin Total: 0.6 mg/dL (ref 0.0–1.2)
CO2: 22 mmol/L (ref 20–29)
Calcium: 9.1 mg/dL (ref 8.7–10.2)
Chloride: 105 mmol/L (ref 96–106)
Creatinine, Ser: 1.02 mg/dL (ref 0.76–1.27)
Globulin, Total: 2.9 g/dL (ref 1.5–4.5)
Glucose: 92 mg/dL (ref 70–99)
Potassium: 4 mmol/L (ref 3.5–5.2)
Sodium: 142 mmol/L (ref 134–144)
Total Protein: 7.5 g/dL (ref 6.0–8.5)
eGFR: 88 mL/min/{1.73_m2} (ref >59)

## 2023-03-12 LAB — LIPID PANEL
Chol/HDL Ratio: 5.3 ratio — ABNORMAL HIGH (ref 0.0–5.0)
Cholesterol, Total: 200 mg/dL — ABNORMAL HIGH (ref 100–199)
HDL: 38 mg/dL — ABNORMAL LOW (ref >39)
LDL Chol Calc (NIH): 139 mg/dL — ABNORMAL HIGH (ref 0–99)
Triglycerides: 124 mg/dL (ref 0–149)
VLDL Cholesterol Cal: 23 mg/dL (ref 5–40)

## 2023-03-12 LAB — HEMOGLOBIN A1C
Est. average glucose Bld gHb Est-mCnc: 137 mg/dL
Hgb A1c MFr Bld: 6.4 % — ABNORMAL HIGH (ref 4.8–5.6)

## 2023-03-12 LAB — PSA: Prostate Specific Ag, Serum: 0.5 ng/mL (ref 0.0–4.0)

## 2023-03-18 ENCOUNTER — Telehealth: Payer: Self-pay | Admitting: Family Medicine

## 2023-03-18 NOTE — Telephone Encounter (Signed)
Patient dropped off FMLA paperwork to be completed. Last DOS was 03/11/23. Placed in [employer].

## 2023-03-19 NOTE — Telephone Encounter (Signed)
Reviewed form and placed in PCP's box for completion.  .Michelle R Simpson, CMA  

## 2023-03-23 ENCOUNTER — Encounter: Payer: Self-pay | Admitting: Family Medicine

## 2023-04-06 DIAGNOSIS — G4733 Obstructive sleep apnea (adult) (pediatric): Secondary | ICD-10-CM | POA: Diagnosis not present

## 2023-05-07 DIAGNOSIS — G4733 Obstructive sleep apnea (adult) (pediatric): Secondary | ICD-10-CM | POA: Diagnosis not present

## 2023-05-13 ENCOUNTER — Encounter: Payer: Self-pay | Admitting: Family Medicine

## 2023-05-20 NOTE — Telephone Encounter (Signed)
Called patient and he apologizes because he does not use MYCHART so he never got the message.  He states that Dr. Leveda Anna filled out FMLA paperwork for his chronic condition every 3 to 4 years. He states that his condition is a lifetime ailment per Dr. Leveda Anna.  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 This does not allow him to do his job.  He is seeking intermittent leave during flares as well as appointments and leave.  If there are any additional questions patient can be reached at [phone].  Glennie Hawk, CMA

## 2023-05-22 NOTE — Telephone Encounter (Signed)
Patient called and informed that forms are ready for pick up. Copy made and placed in batch scanning. Original placed at front desk for pick up.   Devinn Hurwitz C Marjan Rosman, RN  

## 2023-07-16 ENCOUNTER — Telehealth: Payer: Self-pay

## 2023-07-16 NOTE — Telephone Encounter (Signed)
Patient calls nurse line in regards to Bariatric referral.   Information for [facility] given to patient.   Patient advised to call to set up his first apt.   Patient agreed with plan.

## 2023-08-12 DIAGNOSIS — G4733 Obstructive sleep apnea (adult) (pediatric): Secondary | ICD-10-CM | POA: Diagnosis not present

## 2023-09-12 DIAGNOSIS — G4733 Obstructive sleep apnea (adult) (pediatric): Secondary | ICD-10-CM | POA: Diagnosis not present

## 2023-10-13 DIAGNOSIS — G4733 Obstructive sleep apnea (adult) (pediatric): Secondary | ICD-10-CM | POA: Diagnosis not present

## 2024-01-05 DIAGNOSIS — G4733 Obstructive sleep apnea (adult) (pediatric): Secondary | ICD-10-CM | POA: Diagnosis not present

## 2024-01-21 DIAGNOSIS — G4733 Obstructive sleep apnea (adult) (pediatric): Secondary | ICD-10-CM | POA: Diagnosis not present

## 2024-01-28 NOTE — Progress Notes (Unsigned)
 There were no vitals taken for this visit.   Subjective:    Patient ID: Joel Flatter., male    DOB: Apr 03, 1971, 53 y.o.   MRN: [id number]  HPI: Joel Lukehart. is a 53 y.o. male presenting on 01/29/2024 for comprehensive medical examination. Current medical complaints include:{Blank single:19197::"none","***"}  OBESITY - Meds: semaglutide  - Previously on *** - Complications of obesity: prediabetes, HLD, back pain - Peak weight: *** - Weight loss goal: *** - Weight loss to date: *** - Diet: *** - Exercise: ***  HLD - medications: crestor  - compliance: *** - medication SEs: ***   He currently lives with: Interim Problems from his last visit: {Blank single:19197::"yes","no"}  Depression Screen done today and results listed below:     03/11/2023    9:04 AM 09/17/2022    8:30 AM 12/26/2021    8:42 AM 05/28/2020    8:35 AM 02/16/2020    9:13 AM  Depression screen PHQ 2/9  Decreased Interest 1 2 0 0 0  Down, Depressed, Hopeless 0 0 0 0 0  PHQ - 2 Score 1 2 0 0 0  Altered sleeping 0 0 0 0   Tired, decreased energy 0 0 1 1   Change in appetite 0 1 0 0   Feeling bad or failure about yourself  1 0 0 0   Trouble concentrating 0 0 0 0   Moving slowly or fidgety/restless 2 0 0 0   Suicidal thoughts 0 0 0 0   PHQ-9 Score 4 3 1 1    Difficult doing work/chores Somewhat difficult Not difficult at all Not difficult at all      The patient {has/does not have:19849} a history of falls. I {did/did not:19850} complete a risk assessment for falls. A plan of care for falls {was/was not:19852} documented.  Past medical history, surgical history, medications, allergies, family history and social history reviewed with patient today and changes made to appropriate areas of the chart.      Objective:     There were no vitals taken for this visit.  Wt Readings from Last 3 Encounters:  03/11/23 (!) 348 lb 3.2 oz (157.9 kg)  09/17/22 (!) 338 lb 9.6 oz (153.6 kg)  04/20/22 (!) 312 lb  (141.5 kg)    Physical Exam  Results for orders placed or performed in visit on 03/11/23  Lipid panel   Collection Time: 03/11/23  9:40 AM  Result Value Ref Range   Cholesterol, Total 200 (H) 100 - 199 mg/dL   Triglycerides 865 0 - 149 mg/dL   HDL 38 (L) >78 mg/dL   VLDL Cholesterol Cal 23 5 - 40 mg/dL   LDL Chol Calc (NIH) 469 (H) 0 - 99 mg/dL   Chol/HDL Ratio 5.3 (H) 0.0 - 5.0 ratio  Hemoglobin A1c   Collection Time: 03/11/23  9:40 AM  Result Value Ref Range   Hgb A1c MFr Bld 6.4 (H) 4.8 - 5.6 %   Est. average glucose Bld gHb Est-mCnc 137 mg/dL  PSA   Collection Time: 03/11/23  9:40 AM  Result Value Ref Range   Prostate Specific Ag, Serum 0.5 0.0 - 4.0 ng/mL  Comprehensive metabolic panel   Collection Time: 03/11/23  9:40 AM  Result Value Ref Range   Glucose 92 70 - 99 mg/dL   BUN 15 6 - 24 mg/dL   Creatinine, Ser 6.29 0.76 - 1.27 mg/dL   eGFR 88 >52 WU/XLK/4.40   BUN/Creatinine Ratio 15 9 - 20  Sodium 142 134 - 144 mmol/L   Potassium 4.0 3.5 - 5.2 mmol/L   Chloride 105 96 - 106 mmol/L   CO2 22 20 - 29 mmol/L   Calcium  9.1 8.7 - 10.2 mg/dL   Total Protein 7.5 6.0 - 8.5 g/dL   Albumin 4.6 3.8 - 4.9 g/dL   Globulin, Total 2.9 1.5 - 4.5 g/dL   Bilirubin Total 0.6 0.0 - 1.2 mg/dL   Alkaline Phosphatase 87 44 - 121 IU/L   AST 25 0 - 40 IU/L   ALT 27 0 - 44 IU/L      Assessment & Plan:   Problem List Items Addressed This Visit   None    LABORATORY TESTING:  Health maintenance labs ordered today as discussed above.   The natural history of prostate cancer and ongoing controversy regarding screening and potential treatment outcomes of prostate cancer has been discussed with the patient. The meaning of a false positive PSA and a false negative PSA has been discussed. He indicates understanding of the limitations of this screening test and wishes *** to proceed with screening PSA testing.   IMMUNIZATIONS:   - Tdap: Tetanus vaccination status reviewed: last tetanus  booster within 10 years. - Influenza: Postponed to flu season - Pneumococcal: Not applicable - HPV: Not applicable - Shingrix  vaccine: Up to date - COVID vaccine: UTD  SCREENING: - Colonoscopy: Up to date  Discussed with patient purpose of the colonoscopy is to detect colon cancer at curable precancerous or early stages   - AAA Screening: Not applicable  - Lung cancer screening: ***  Hep C Screening: *** STD testing and prevention (HIV/chl/gon/syphilis): *** Sexual History: Incontinence Symptoms:   PATIENT COUNSELING:    Advanced Care Planning: A voluntary discussion about advance care planning including the explanation and discussion of advance directives.  Discussed health care proxy and Living will, and the patient was able to identify a health care proxy as ***.  Patient {DOES_DOES BJY:78295} have a living will at present time. If patient does have living will, I have requested they bring this to the clinic to be scanned in to their chart.  Sexuality: Discussed sexually transmitted diseases, partner selection, use of condoms, avoidance of unintended pregnancy  and contraceptive alternatives.   Advised to avoid cigarette smoking.  I discussed with the patient that most people either abstain from alcohol or drink within safe limits (<=14/week and <=4 drinks/occasion for males, <=7/weeks and <= 3 drinks/occasion for females) and that the risk for alcohol disorders and other health effects rises proportionally with the number of drinks per week and how often a drinker exceeds daily limits.  Discussed cessation/primary prevention of drug use and availability of treatment for abuse.   Diet: Encouraged to adjust caloric intake to maintain  or achieve ideal body weight, to reduce intake of dietary saturated fat and total fat, to limit sodium intake by avoiding high sodium foods and not adding table salt, and to maintain adequate dietary potassium and calcium  preferably from fresh fruits,  vegetables, and low-fat dairy products.    stressed the importance of regular exercise  Injury prevention: Discussed safety belts, safety helmets, smoke detector, smoking near bedding or upholstery.   Dental health: Discussed importance of regular tooth brushing, flossing, and dental visits.   Follow up plan: NEXT PREVENTATIVE PHYSICAL DUE IN 1 YEAR. No follow-ups on file.

## 2024-01-29 ENCOUNTER — Other Ambulatory Visit (HOSPITAL_COMMUNITY)
Admission: RE | Admit: 2024-01-29 | Discharge: 2024-01-29 | Disposition: A | Discharge status: Home / Self Care | Source: Ambulatory Visit | Attending: Family Medicine | Admitting: Family Medicine

## 2024-01-29 ENCOUNTER — Other Ambulatory Visit (HOSPITAL_COMMUNITY): Payer: Self-pay

## 2024-01-29 ENCOUNTER — Ambulatory Visit: Admitting: Family Medicine

## 2024-01-29 ENCOUNTER — Encounter: Payer: Self-pay | Admitting: Family Medicine

## 2024-01-29 ENCOUNTER — Telehealth: Payer: Self-pay

## 2024-01-29 ENCOUNTER — Other Ambulatory Visit: Payer: Self-pay | Admitting: Family Medicine

## 2024-01-29 VITALS — BP 129/75 | HR 69 | Ht 70.5 in | Wt 352.8 lb

## 2024-01-29 DIAGNOSIS — R7303 Prediabetes: Secondary | ICD-10-CM

## 2024-01-29 DIAGNOSIS — M150 Primary generalized (osteo)arthritis: Secondary | ICD-10-CM

## 2024-01-29 DIAGNOSIS — E7800 Pure hypercholesterolemia, unspecified: Secondary | ICD-10-CM | POA: Diagnosis not present

## 2024-01-29 DIAGNOSIS — G4733 Obstructive sleep apnea (adult) (pediatric): Secondary | ICD-10-CM

## 2024-01-29 DIAGNOSIS — Z202 Contact with and (suspected) exposure to infections with a predominantly sexual mode of transmission: Secondary | ICD-10-CM | POA: Insufficient documentation

## 2024-01-29 DIAGNOSIS — Z3009 Encounter for other general counseling and advice on contraception: Secondary | ICD-10-CM

## 2024-01-29 LAB — POCT GLYCOSYLATED HEMOGLOBIN (HGB A1C): HbA1c, POC (prediabetic range): 6.1 % (ref 5.7–6.4)

## 2024-01-29 MED ORDER — TIRZEPATIDE 2.5 MG/0.5ML ~~LOC~~ SOAJ: 2.5000 mg | SUBCUTANEOUS | Status: DC

## 2024-01-29 MED ORDER — TIRZEPATIDE 5 MG/0.5ML ~~LOC~~ SOAJ: 5.0000 mg | SUBCUTANEOUS | 0 refills | Status: DC

## 2024-01-29 MED ORDER — TIRZEPATIDE-WEIGHT MANAGEMENT 5 MG/0.5ML ~~LOC~~ SOLN: 5.0000 mg | SUBCUTANEOUS | 0 refills | Status: DC

## 2024-01-29 MED ORDER — METFORMIN HCL ER 500 MG PO TB24: ORAL_TABLET | ORAL | 3 refills | Status: AC

## 2024-01-29 NOTE — Assessment & Plan Note (Signed)
 Metformin trial

## 2024-01-29 NOTE — Telephone Encounter (Signed)
 Ozempic /Mounjaro  is approved exclusively as an adjunct to diet and exercise to improve glycemic control in adults with type 2 diabetes mellitus. A review of patient's medical chart reveals no documented diagnosis of type 2 diabetes or an A1C indicative of diabetes. Therefore, they do not currently meet the criteria for prior authorization of this medication. If clinically appropriate, alternative options such as Saxenda , Zepbound , or Wegovy  may be considered for this patient.  *****If the A1C from today comes back over 6.5 and there is a new Type 2 diabetes diagnosis, we can run the prior authorization then.  A user error has taken place: encounter opened in error, closed for administrative reasons.

## 2024-01-29 NOTE — Assessment & Plan Note (Signed)
 Counseled on statin use. Obtain lipid panel today.

## 2024-01-29 NOTE — Addendum Note (Signed)
 Addended by: Kandis Ormond on: 01/29/2024 03:24 PM   Modules accepted: Orders

## 2024-01-29 NOTE — Patient Instructions (Addendum)
 It was great to see you!  Our plans for today:  - Try zyrtec  for your cough.  - We are referring you to Urology, Sleep medicine. Let us  know if you don't hear about an appointment in the next few weeks.  - Start the metformin and mounjaro  for your weight, sleep apnea and prediabetes.  - Start back taking your rosuvastatin . - Come back in 4 weeks for follow up for the mounjaro .   We are checking some labs today, we will release these results to your MyChart.  Take care and seek immediate care sooner if you develop any concerns.   Dr. Jestin Burbach

## 2024-01-29 NOTE — Assessment & Plan Note (Signed)
 Weight gain off GLP1 agonist. Counseled on diet and exercise efforts. Repeat A1c today prediabetic. Rx metformin, tirzepatide . Tirzepatide  sample given today. F/u 1 month.

## 2024-01-29 NOTE — Assessment & Plan Note (Addendum)
 Refer to sleep medicine for evaluation of inspire device. Weight loss is paramount. Rx zepbound.

## 2024-01-30 ENCOUNTER — Ambulatory Visit: Payer: Self-pay | Admitting: Family Medicine

## 2024-01-30 LAB — BASIC METABOLIC PANEL WITH GFR
BUN/Creatinine Ratio: 14 (ref 9–20)
BUN: 12 mg/dL (ref 6–24)
CO2: 22 mmol/L (ref 20–29)
Calcium: 9.3 mg/dL (ref 8.7–10.2)
Chloride: 105 mmol/L (ref 96–106)
Creatinine, Ser: 0.87 mg/dL (ref 0.76–1.27)
Glucose: 107 mg/dL — ABNORMAL HIGH (ref 70–99)
Potassium: 4.4 mmol/L (ref 3.5–5.2)
Sodium: 143 mmol/L (ref 134–144)
eGFR: 103 mL/min/{1.73_m2} (ref >59)

## 2024-01-30 LAB — LIPID PANEL
Chol/HDL Ratio: 6.7 ratio — ABNORMAL HIGH (ref 0.0–5.0)
Cholesterol, Total: 247 mg/dL — ABNORMAL HIGH (ref 100–199)
HDL: 37 mg/dL — ABNORMAL LOW (ref >39)
LDL Chol Calc (NIH): 184 mg/dL — ABNORMAL HIGH (ref 0–99)
Triglycerides: 140 mg/dL (ref 0–149)
VLDL Cholesterol Cal: 26 mg/dL (ref 5–40)

## 2024-01-30 LAB — HEPATITIS C ANTIBODY: Hep C Virus Ab: NONREACTIVE

## 2024-01-30 LAB — HIV ANTIBODY (ROUTINE TESTING W REFLEX): HIV Screen 4th Generation wRfx: NONREACTIVE

## 2024-01-30 LAB — RPR: RPR Ser Ql: NONREACTIVE

## 2024-02-01 ENCOUNTER — Other Ambulatory Visit: Payer: Self-pay

## 2024-02-01 DIAGNOSIS — E7800 Pure hypercholesterolemia, unspecified: Secondary | ICD-10-CM

## 2024-02-01 LAB — URINE CYTOLOGY ANCILLARY ONLY
Chlamydia: NEGATIVE
Comment: NEGATIVE
Comment: NEGATIVE
Comment: NORMAL
Neisseria Gonorrhea: NEGATIVE
Trichomonas: POSITIVE — AB

## 2024-02-01 MED ORDER — METRONIDAZOLE 500 MG PO TABS: 2000.0000 mg | ORAL_TABLET | Freq: Once | ORAL | 0 refills | Status: AC

## 2024-02-01 MED ORDER — ROSUVASTATIN CALCIUM 40 MG PO TABS: 40.0000 mg | ORAL_TABLET | Freq: Every day | ORAL | 3 refills | Status: AC

## 2024-02-03 NOTE — Telephone Encounter (Signed)
 Patient returns call to nurse line regarding Mounjaro .   Advised that PA was denied as he does not have dx of Type II diabetes.   Dr. Rumball- patient is agreeable to receiving alternative (Zepbound ). Can you send rx to his pharmacy?   Elsie Halo, RN

## 2024-02-04 MED ORDER — TIRZEPATIDE-WEIGHT MANAGEMENT 5 MG/0.5ML ~~LOC~~ SOAJ: 5.0000 mg | SUBCUTANEOUS | 0 refills | Status: DC

## 2024-02-04 NOTE — Telephone Encounter (Signed)
 Rx sent.

## 2024-02-05 ENCOUNTER — Other Ambulatory Visit (HOSPITAL_COMMUNITY): Payer: Self-pay

## 2024-02-05 ENCOUNTER — Telehealth: Payer: Self-pay

## 2024-02-05 DIAGNOSIS — G4733 Obstructive sleep apnea (adult) (pediatric): Secondary | ICD-10-CM | POA: Diagnosis not present

## 2024-02-05 NOTE — Telephone Encounter (Signed)
 Pharmacy Patient Advocate Encounter   Received notification from CoverMyMeds that prior authorization for ZEPBOUND  is required/requested.   Insurance verification completed.   The patient is insured through [employer] .   Per test claim: PA required; PA submitted to above mentioned insurance via CoverMyMeds Key/confirmation #/EOC [id number]. Status is pending

## 2024-02-08 NOTE — Telephone Encounter (Signed)
 Pharmacy Patient Advocate Encounter  Received notification from [facility] FEP that Prior Authorization for ZEPBOUND  has been DENIED.  Full denial letter will be uploaded to the media tab. See denial reason below.  the use of this medication without inadequate treatment response, intolerance, or contraindication to at least TWO oral medications for weight management (e.g., benzphetamine, diethylpropion, phentermine, Qsymia, etc.) does not establish medical necessity for this drug.

## 2024-02-21 DIAGNOSIS — G4733 Obstructive sleep apnea (adult) (pediatric): Secondary | ICD-10-CM | POA: Diagnosis not present

## 2024-02-24 ENCOUNTER — Telehealth: Payer: Self-pay

## 2024-02-24 MED ORDER — SAXENDA 18 MG/3ML ~~LOC~~ SOPN: 0.6000 mg | PEN_INJECTOR | Freq: Every day | SUBCUTANEOUS | 0 refills | Status: DC

## 2024-02-24 NOTE — Telephone Encounter (Signed)
 Pharmacist with patients [facility] [place] calls nurse line to discuss weight loss therapy.   She reports Wegovy  and Zepbound  are not on patients formulary, hence the multiple denials.   She reports if appropriate we can transition him to Saxenda or Contrave as these are on their formulary list.   Advised will forward to PCP for prescription.

## 2024-02-24 NOTE — Telephone Encounter (Signed)
 Spoke to patient regarding options. Pt elected Saxenda, script sent to pharmacy.

## 2024-02-25 ENCOUNTER — Telehealth: Payer: Self-pay

## 2024-02-25 ENCOUNTER — Other Ambulatory Visit (HOSPITAL_COMMUNITY): Payer: Self-pay

## 2024-02-25 NOTE — Telephone Encounter (Signed)
 Pharmacy Patient Advocate Encounter   Received notification from CoverMyMeds that prior authorization for [facility] is required/requested.   Insurance verification completed.   The patient is insured through [employer] FEP .   PA required; PA submitted to above mentioned insurance via CoverMyMeds Key/confirmation #/EOC [facility]. Status is pending

## 2024-02-25 NOTE — Telephone Encounter (Signed)
 Pharmacy Patient Advocate Encounter  Received notification from [facility] FEP that Prior Authorization for [facility] has been APPROVED from 02/25/24 to 08/23/24   PA #/Case ID/Reference #: [id number]

## 2024-02-29 ENCOUNTER — Ambulatory Visit: Admitting: Family Medicine

## 2024-03-01 ENCOUNTER — Telehealth: Payer: Self-pay

## 2024-03-01 MED ORDER — SAXENDA 18 MG/3ML ~~LOC~~ SOPN: 0.6000 mg | PEN_INJECTOR | Freq: Every day | SUBCUTANEOUS | 0 refills | Status: DC

## 2024-03-01 NOTE — Telephone Encounter (Signed)
 Received call from pharmacy regarding Saxenda  prescription.   They are requesting that provider send prescription with dispense quantity of 15 ml, as they are not able to break the box to dispense quantity of 6 ml.   I have updated prescription and pended to encounter.   Forwarding to provider to review and send in if appropriate.   Chiquita JAYSON English, RN

## 2024-03-03 MED ORDER — SAXENDA 18 MG/3ML ~~LOC~~ SOPN: 0.6000 mg | PEN_INJECTOR | Freq: Every day | SUBCUTANEOUS | 0 refills | Status: DC

## 2024-03-03 NOTE — Telephone Encounter (Signed)
 Patient returns call to nurse line regarding Saxenda  prescription.   He is asking that [facility].   He has already cancelled prescription at [facility].   Resent prescription to preferred mail order pharmacy.   Chiquita JAYSON English, RN

## 2024-03-03 NOTE — Addendum Note (Signed)
 Addended by: Cosima Prentiss C on: 03/03/2024 02:45 PM   Modules accepted: Orders

## 2024-03-16 NOTE — Progress Notes (Deleted)
   SUBJECTIVE:   CHIEF COMPLAINT / HPI:   OBESITY - Meds: saxenda  - Previously on semaglutide  - Complications of obesity: prediabetes, HLD, back pain  - Peak weight: *** - Weight loss goal: *** - Weight loss to date: *** - Diet: 1-2 meals per day. Doesn't eat breakfast, trying to intermittently fast.  - Exercise: none currently, planning on setting up home gym  OSA - referred to sleep studies at last visit.***  Trichomonas - positive last visit. Needs TOC.  OBJECTIVE:   There were no vitals taken for this visit.  ***  ASSESSMENT/PLAN:   No problem-specific Assessment & Plan notes found for this encounter.     Donald CHRISTELLA Lai, DO

## 2024-03-17 ENCOUNTER — Ambulatory Visit: Admitting: Family Medicine

## 2024-03-22 DIAGNOSIS — G4733 Obstructive sleep apnea (adult) (pediatric): Secondary | ICD-10-CM | POA: Diagnosis not present

## 2024-04-22 DIAGNOSIS — E119 Type 2 diabetes mellitus without complications: Secondary | ICD-10-CM | POA: Diagnosis not present

## 2024-04-22 DIAGNOSIS — H40053 Ocular hypertension, bilateral: Secondary | ICD-10-CM | POA: Diagnosis not present

## 2024-04-22 DIAGNOSIS — H5203 Hypermetropia, bilateral: Secondary | ICD-10-CM | POA: Diagnosis not present

## 2024-05-20 ENCOUNTER — Ambulatory Visit: Admitting: Family Medicine

## 2024-06-03 ENCOUNTER — Encounter: Payer: Self-pay | Admitting: Family Medicine

## 2024-06-03 ENCOUNTER — Other Ambulatory Visit (HOSPITAL_COMMUNITY)
Admission: RE | Admit: 2024-06-03 | Discharge: 2024-06-03 | Disposition: A | Discharge status: Home / Self Care | Source: Ambulatory Visit | Attending: Family Medicine | Admitting: Family Medicine

## 2024-06-03 ENCOUNTER — Ambulatory Visit: Payer: Self-pay | Admitting: Family Medicine

## 2024-06-03 ENCOUNTER — Ambulatory Visit (INDEPENDENT_AMBULATORY_CARE_PROVIDER_SITE_OTHER): Admitting: Family Medicine

## 2024-06-03 VITALS — BP 124/72 | HR 70 | Ht 70.0 in | Wt 349.8 lb

## 2024-06-03 DIAGNOSIS — E7800 Pure hypercholesterolemia, unspecified: Secondary | ICD-10-CM

## 2024-06-03 DIAGNOSIS — Z202 Contact with and (suspected) exposure to infections with a predominantly sexual mode of transmission: Secondary | ICD-10-CM

## 2024-06-03 DIAGNOSIS — R7303 Prediabetes: Secondary | ICD-10-CM

## 2024-06-03 LAB — POCT GLYCOSYLATED HEMOGLOBIN (HGB A1C): HbA1c, POC (prediabetic range): 5.8 % (ref 5.7–6.4)

## 2024-06-03 MED ORDER — LIRAGLUTIDE -WEIGHT MANAGEMENT 18 MG/3ML ~~LOC~~ SOPN: 1.8000 mg | PEN_INJECTOR | Freq: Every day | SUBCUTANEOUS | 0 refills | Status: DC

## 2024-06-03 NOTE — Assessment & Plan Note (Signed)
Tolerant of statin, continue. Recheck lipid panel.

## 2024-06-03 NOTE — Patient Instructions (Signed)
 It was great to see you!  Our plans for today:  - Increase your saxenda /liraglutide  to 1.8mg . If you tolerate this after about a week, you can increase to 2.4mg . - We are checking some labs today, we will release these results to your MyChart. - Come back in 4 weeks for recheck.   Take care and seek immediate care sooner if you develop any concerns.   Dr. Ildefonso Keaney

## 2024-06-03 NOTE — Progress Notes (Signed)
   SUBJECTIVE:   CHIEF COMPLAINT / HPI:   Discussed the use of AI scribe software for clinical note transcription with the patient, who gave verbal consent to proceed.  History of Present Illness Joel West. is a 53 year old male who presents for weight management.  Obesity and weight management - Currently using liraglutide  for weight loss, started at 0.6 mg and increased to 1.2 mg two weeks ago - Weight loss of only three pounds over four months - Follows a healthy diet including fruits and salads - Exercises at home using a gym bench, aiming for twice-daily sessions - has prediabetes - Takes metformin , two pills daily  Hyperlipidemia - Takes rosuvastatin  daily for cholesterol management  STI - prior trichomonas, s/p treatment - condom recently broke, wants retesting.   OBJECTIVE:   BP 124/72   Pulse 70   Ht 5' 10 (1.778 m)   Wt (!) 349 lb 12.8 oz (158.7 kg)   SpO2 98%   BMI 50.19 kg/m   Gen: well appearing, in NAD Card: RRR Lungs: CTAB Ext: WWP, no edema  ASSESSMENT/PLAN:   Morbid obesity (HCC) Contributing to prediabetes, OSA, HLD. Increase saxenda  to 1.8mg , refill sent. Continue metformin . Continue lifestyle efforts.  HYPERCHOLESTEROLEMIA Tolerant of statin, continue. Recheck lipid panel.   Possible exposure to STI STI testing as ordered, treat as indicated.  F/u 4 weeks for obesity.   Donald CHRISTELLA Lai, DO

## 2024-06-03 NOTE — Assessment & Plan Note (Addendum)
 Contributing to prediabetes, OSA, HLD. Increase saxenda  to 1.8mg , refill sent. Continue metformin . Continue lifestyle efforts.

## 2024-06-04 LAB — LIPID PANEL
Chol/HDL Ratio: 3.4 ratio (ref 0.0–5.0)
Cholesterol, Total: 124 mg/dL (ref 100–199)
HDL: 37 mg/dL — ABNORMAL LOW (ref >39)
LDL Chol Calc (NIH): 65 mg/dL (ref 0–99)
Triglycerides: 119 mg/dL (ref 0–149)
VLDL Cholesterol Cal: 22 mg/dL (ref 5–40)

## 2024-06-04 LAB — RPR: RPR Ser Ql: NONREACTIVE

## 2024-06-04 LAB — HEPATITIS C ANTIBODY: Hep C Virus Ab: NONREACTIVE

## 2024-06-04 LAB — HIV ANTIBODY (ROUTINE TESTING W REFLEX): HIV Screen 4th Generation wRfx: NONREACTIVE

## 2024-06-06 LAB — URINE CYTOLOGY ANCILLARY ONLY
Chlamydia: NEGATIVE
Comment: NEGATIVE
Comment: NEGATIVE
Comment: NORMAL
Neisseria Gonorrhea: NEGATIVE
Trichomonas: NEGATIVE

## 2024-06-10 NOTE — Progress Notes (Signed)
 Joel West.                                          MRN: [id number]   06/10/2024   The VBCI Quality Team Specialist reviewed this patient medical record for the purposes of chart review for care gap closure. The following were reviewed: chart review for care gap closure-kidney health evaluation for diabetes:eGFR  and uACR.    VBCI Quality Team

## 2024-06-13 ENCOUNTER — Telehealth: Payer: Self-pay

## 2024-06-13 NOTE — Telephone Encounter (Signed)
 Patient calls nurse line regarding Saxenda  prescription.   He reports that this injection is supposed to be sent [facility].   He also reports that he has increased from 1.8 to 2.4 mg weekly. He has been tolerating this well.   He is requesting that updated rx be sent to [facility].   I have pended this to this encounter.   Chiquita JAYSON English, RN

## 2024-06-14 MED ORDER — LIRAGLUTIDE -WEIGHT MANAGEMENT 18 MG/3ML ~~LOC~~ SOPN: 2.4000 mg | PEN_INJECTOR | Freq: Every day | SUBCUTANEOUS | 0 refills | Status: DC

## 2024-06-15 ENCOUNTER — Telehealth: Payer: Self-pay

## 2024-06-15 ENCOUNTER — Other Ambulatory Visit (HOSPITAL_COMMUNITY): Payer: Self-pay

## 2024-06-15 NOTE — Telephone Encounter (Signed)
 Patient calls nurse line requesting a sample of Saxenda .   He reports his mail order pharmacy will not be able to deliver his medication until next week.   He reports he will be short several days of dosing.   Advised unsure if we have samples, however will forward to pharmacy team for assistance.

## 2024-06-15 NOTE — Telephone Encounter (Signed)
 Reviewed and agree with Dr Rennis plan.

## 2024-06-15 NOTE — Telephone Encounter (Signed)
 Patient contacted for follow-up of request for sample of liraglutide  (Saxxenda)  Shared with patient that we do not have any samples and unfortunately due to supply being filled recently (yesterday) we were unable to try and submit a test claim as insurance will not process.  Cost was communicated as the cost would be at same level as uninsured.   Advised to wait until delivery of Saxxenda on 10/27 and then restart medication.  Apologized for lack of options/samples.  Patient verbalized understanding of treatment plan.   Total time with patient call and documentation of interaction: 12 minutes.

## 2024-07-04 ENCOUNTER — Encounter: Payer: Self-pay | Admitting: Family Medicine

## 2024-07-04 ENCOUNTER — Ambulatory Visit: Admitting: Family Medicine

## 2024-07-04 VITALS — BP 132/70 | HR 70 | Ht 70.5 in | Wt 348.2 lb

## 2024-07-04 DIAGNOSIS — R7303 Prediabetes: Secondary | ICD-10-CM

## 2024-07-04 DIAGNOSIS — E7800 Pure hypercholesterolemia, unspecified: Secondary | ICD-10-CM | POA: Diagnosis not present

## 2024-07-04 DIAGNOSIS — M5416 Radiculopathy, lumbar region: Secondary | ICD-10-CM | POA: Diagnosis not present

## 2024-07-04 MED ORDER — LIRAGLUTIDE -WEIGHT MANAGEMENT 18 MG/3ML ~~LOC~~ SOPN: 3.0000 mg | PEN_INJECTOR | Freq: Every day | SUBCUTANEOUS | 3 refills | Status: AC

## 2024-07-04 NOTE — Patient Instructions (Addendum)
 It was great to see you!  Our plans for today:  - Continue your Saxenda  shot at 3mg . - See below for tips on eating healthy.  - Come back at the end of January for follow up.   Take care and seek immediate care sooner if you develop any concerns.   Dr. Madelon   Here is an example of what a healthy plate looks like:    ? Make half your plate fruits and vegetables.     ? Focus on whole fruits.     ? Vary your veggies.  ? Make half your grains whole grains. -     ? Look for the word "whole" at the beginning of the ingredients list    ? Some whole-grain ingredients include whole oats, whole-wheat flour,        whole-grain corn, whole-grain brown rice, and whole rye.  ? Move to low-fat and fat-free milk or yogurt.  ? Vary your protein routine. - Meat, fish, poultry (chicken, turkey), eggs, beans (kidney, pinto), dairy.  ? Drink and eat less sodium, saturated fat, and added sugars.  Look for opportunities to move your body throughout your day:  Never lie down when you can sit; never sit when you can stand; never stand when you can pace.  Moving your body throughout the day is just as important as the 30 or 60 minutes of exercise at the gym!  Get social Get active with your friends instead of going out to eat. Go for a hike, walk around the mall, or play an exercise-themed video game.   Move more at work Fit more activity into the workday. Stand during phone calls, use a printer farther from your desk, and get up to stretch each hour.    Do something new Develop a new skill to kick-start your motivation. Sign up for a class to learn how to teachers insurance and annuity association, surf, do tai chi, or play a sport.    Keep cool in the pool Don't like to sweat? Hit the local community pool for a swim, water polo, or water aerobics class to stay cool while exercising.    Stay on track Use a fitness tracker (FITBIT, Fitness Pal mobile app) to track your activity and provide motivation to reach your  goals.

## 2024-07-04 NOTE — Assessment & Plan Note (Signed)
 Contributing to prediabetes, OSA, HLD. Continue saxenda  at 3mg , refill sent. Continue metformin . Continue lifestyle efforts, discussed.

## 2024-07-04 NOTE — Assessment & Plan Note (Signed)
 Continue saxenda , metformin . Continue dietary and exercise efforts, discussed.

## 2024-07-04 NOTE — Progress Notes (Signed)
   SUBJECTIVE:   CHIEF COMPLAINT / HPI:   Discussed the use of AI scribe software for clinical note transcription with the patient, who gave verbal consent to proceed.  History of Present Illness Joel West. is a 53 year old male with chronic back pain who presents for FMLA paperwork related to intermittent back pain flare-ups and obesity followup.  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 - requesting intermittent FMLA for flareups.  Weight management therapy - Currently taking Saxenda  (liraglutide ), increased dose to 3 mg daily one week ago - Taking metformin , two pills daily - No significant gastrointestinal side effects such as abdominal pain or diarrhea - last A1c 5.8 - Physical activity limited by long work hours - Plans to incorporate a 30-minute walk during work breaks - Dietary modifications include avoiding chips, cookies, and fried foods, planning to start more restrictions starting 08/25/24. - Considering increasing intake of whole grains and reducing processed foods    OBJECTIVE:   BP 132/70   Pulse 70   Ht 5' 10.5 (1.791 m)   Wt (!) 348 lb 3.2 oz (157.9 kg)   SpO2 93%   BMI 49.26 kg/m   Gen: well appearing, in NAD Card: Reg rate Lungs: Comfortable WOB on RA Ext: WWP  ASSESSMENT/PLAN:   Lumbar back pain with radiculopathy affecting left lower extremity Chronic, stable. Will complete FMLA paperwork. Continue prn NSAID. Weight loss will be of benefit.  Morbid obesity (HCC) Contributing to prediabetes, OSA, HLD. Continue saxenda  at 3mg , refill sent. Continue metformin . Continue lifestyle efforts, discussed.  HYPERCHOLESTEROLEMIA Tolerant of statin, continue. Last lipid panel improved.  Prediabetes Continue  saxenda , metformin . Continue dietary and exercise efforts, discussed.     F/u 3 months.   Donald CHRISTELLA Lai, DO

## 2024-07-04 NOTE — Assessment & Plan Note (Signed)
 Chronic, stable. Will complete FMLA paperwork. Continue prn NSAID. Weight loss will be of benefit.

## 2024-07-04 NOTE — Assessment & Plan Note (Signed)
 Tolerant of statin, continue. Last lipid panel improved.

## 2024-07-21 ENCOUNTER — Other Ambulatory Visit: Payer: Self-pay | Admitting: Family Medicine

## 2024-07-21 DIAGNOSIS — M150 Primary generalized (osteo)arthritis: Secondary | ICD-10-CM

## 2024-07-24 DIAGNOSIS — G4733 Obstructive sleep apnea (adult) (pediatric): Secondary | ICD-10-CM | POA: Diagnosis not present

## 2024-08-24 DIAGNOSIS — G4733 Obstructive sleep apnea (adult) (pediatric): Secondary | ICD-10-CM | POA: Diagnosis not present

## 2024-09-02 ENCOUNTER — Other Ambulatory Visit (HOSPITAL_COMMUNITY): Payer: Self-pay

## 2024-09-02 ENCOUNTER — Telehealth: Payer: Self-pay

## 2024-09-02 NOTE — Telephone Encounter (Signed)
 Pharmacy Patient Advocate Encounter   Received notification from [employer] KEY that prior authorization for Liraglutide  -Weight Management 18MG /3ML pen-injectors  is required/requested.   Insurance verification completed.   The patient is insured through [facility].   PA required; PA submitted to above mentioned insurance via Latent Key/confirmation #/EOC [id number]. Status is pending

## 2024-09-05 NOTE — Telephone Encounter (Signed)
 Patient calls nurse line in regards to injectable.   Advised we will give him an update once we receive a determination from his insurance company.   Patient was appreciative.

## 2024-09-06 NOTE — Telephone Encounter (Signed)
 Pharmacy Patient Advocate Encounter  Received notification from [facility] FEP that Prior Authorization for Liraglutide  -Weight Management 18MG /3ML pen-injectors  has been DENIED.  Full denial letter will be uploaded to the media tab. See denial reason below.  continuation of treatment for a patient over 55 years of age without having lost at least 5 percent of baseline body weight OR without having continued to maintain their initial 5 percent weight loss does not establish medical necessity for this drug.   PA #/Case ID/Reference #: [id number]

## 2024-09-12 NOTE — Telephone Encounter (Signed)
 Attempted to call patient to discuss insurance denial.   Patient did not meet weight loss requirements to continue medication.   Please schedule him an apt when he calls back.
